# Patient Record
Sex: Male | Born: 1970
Health system: Southern US, Community
[De-identification: ages and names within clinical notes are randomized; demographics above are authoritative.]

## PROBLEM LIST (undated history)

## (undated) DIAGNOSIS — K5792 Diverticulitis of intestine, part unspecified, without perforation or abscess without bleeding: Secondary | ICD-10-CM

## (undated) DIAGNOSIS — F419 Anxiety disorder, unspecified: Secondary | ICD-10-CM

## (undated) HISTORY — DX: Anxiety disorder, unspecified: F41.9

## (undated) HISTORY — PX: BACK SURGERY: SHX140

## (undated) HISTORY — PX: HERNIA REPAIR: SHX51

## (undated) HISTORY — PX: ELBOW SURGERY: SHX618

---

## 2000-07-11 ENCOUNTER — Emergency Department (HOSPITAL_COMMUNITY): Admission: EM | Admit: 2000-07-11 | Discharge: 2000-07-11 | Payer: Self-pay | Admitting: Emergency Medicine

## 2000-07-11 ENCOUNTER — Encounter: Payer: Self-pay | Admitting: Emergency Medicine

## 2000-07-14 ENCOUNTER — Ambulatory Visit (HOSPITAL_COMMUNITY): Admission: RE | Admit: 2000-07-14 | Discharge: 2000-07-14 | Payer: Self-pay | Admitting: Pulmonary Disease

## 2001-02-25 ENCOUNTER — Ambulatory Visit (HOSPITAL_COMMUNITY): Admission: RE | Admit: 2001-02-25 | Discharge: 2001-02-25 | Payer: Self-pay | Admitting: Pulmonary Disease

## 2002-03-16 ENCOUNTER — Ambulatory Visit (HOSPITAL_COMMUNITY): Admission: RE | Admit: 2002-03-16 | Discharge: 2002-03-16 | Payer: Self-pay | Admitting: Pulmonary Disease

## 2003-12-06 ENCOUNTER — Ambulatory Visit (HOSPITAL_COMMUNITY): Admission: RE | Admit: 2003-12-06 | Discharge: 2003-12-06 | Payer: Self-pay | Admitting: Pulmonary Disease

## 2003-12-06 ENCOUNTER — Ambulatory Visit (HOSPITAL_COMMUNITY): Admission: RE | Admit: 2003-12-06 | Discharge: 2003-12-06 | Payer: Self-pay

## 2007-04-27 ENCOUNTER — Emergency Department (HOSPITAL_COMMUNITY): Admission: EM | Admit: 2007-04-27 | Discharge: 2007-04-27 | Payer: Self-pay | Admitting: Emergency Medicine

## 2007-08-11 ENCOUNTER — Encounter: Admission: RE | Admit: 2007-08-11 | Discharge: 2007-08-11 | Payer: Self-pay | Admitting: Gastroenterology

## 2010-10-09 LAB — BASIC METABOLIC PANEL
BUN: 10
CO2: 25
Calcium: 9.7
Chloride: 102
Creatinine, Ser: 1.07
GFR calc Af Amer: >60
GFR calc non Af Amer: >60
Glucose, Bld: 87
Potassium: 4.2
Sodium: 136

## 2010-10-09 LAB — CBC
HCT: 41.2
Hemoglobin: 13.9
MCHC: 33.8
MCV: 87.8
Platelets: 202
RBC: 4.7
RDW: 13.4
WBC: 13.2 — ABNORMAL HIGH

## 2010-10-09 LAB — DIFFERENTIAL
Basophils Absolute: 0.1
Basophils Relative: 0
Eosinophils Absolute: 0.1
Eosinophils Relative: 0
Lymphocytes Relative: 13
Lymphs Abs: 1.7
Monocytes Absolute: 0.8
Monocytes Relative: 6
Neutro Abs: 10.5 — ABNORMAL HIGH
Neutrophils Relative %: 80 — ABNORMAL HIGH

## 2010-10-09 LAB — URINALYSIS, ROUTINE W REFLEX MICROSCOPIC
Bilirubin Urine: NEGATIVE
Glucose, UA: NEGATIVE
Hgb urine dipstick: NEGATIVE
Ketones, ur: NEGATIVE
Nitrite: NEGATIVE
Protein, ur: NEGATIVE
Specific Gravity, Urine: 1.013
Urobilinogen, UA: 0.2
pH: 6

## 2010-10-09 LAB — LIPASE, BLOOD: Lipase: 38

## 2011-04-25 ENCOUNTER — Encounter (HOSPITAL_COMMUNITY): Payer: Self-pay | Admitting: Emergency Medicine

## 2011-04-25 ENCOUNTER — Emergency Department (HOSPITAL_COMMUNITY)
Admission: EM | Admit: 2011-04-25 | Discharge: 2011-04-25 | Disposition: A | Discharge status: Home / Self Care | Payer: Worker's Compensation | Attending: Emergency Medicine | Admitting: Emergency Medicine

## 2011-04-25 DIAGNOSIS — S058X9A Other injuries of unspecified eye and orbit, initial encounter: Secondary | ICD-10-CM | POA: Insufficient documentation

## 2011-04-25 DIAGNOSIS — S0501XA Injury of conjunctiva and corneal abrasion without foreign body, right eye, initial encounter: Secondary | ICD-10-CM

## 2011-04-25 DIAGNOSIS — Y9269 Other specified industrial and construction area as the place of occurrence of the external cause: Secondary | ICD-10-CM | POA: Insufficient documentation

## 2011-04-25 DIAGNOSIS — IMO0002 Reserved for concepts with insufficient information to code with codable children: Secondary | ICD-10-CM | POA: Insufficient documentation

## 2011-04-25 HISTORY — DX: Diverticulitis of intestine, part unspecified, without perforation or abscess without bleeding: K57.92

## 2011-04-25 MED ORDER — FLUORESCEIN SODIUM 1 MG OP STRP
ORAL_STRIP | OPHTHALMIC | Status: AC
  Administered 2011-04-25: 1 via OPHTHALMIC
  Filled 2011-04-25: qty 1

## 2011-04-25 MED ORDER — REFRESH P.M. OP OINT: TOPICAL_OINTMENT | OPHTHALMIC | Status: AC | PRN

## 2011-04-25 MED ORDER — TETRACAINE HCL 0.5 % OP SOLN
1.0000 [drp] | Freq: Once | OPHTHALMIC | Status: AC
  Administered 2011-04-25: 1 [drp] via OPHTHALMIC
  Filled 2011-04-25: qty 2

## 2011-04-25 MED ORDER — FLUORESCEIN SODIUM 1 MG OP STRP
1.0000 | ORAL_STRIP | Freq: Once | OPHTHALMIC | Status: AC
  Administered 2011-04-25: 1 via OPHTHALMIC

## 2011-04-25 MED ORDER — BACITRACIN 500 UNIT/GM OP OINT: TOPICAL_OINTMENT | Freq: Four times a day (QID) | OPHTHALMIC | Status: AC

## 2011-04-25 MED ORDER — REFRESH P.M. OP OINT: TOPICAL_OINTMENT | OPHTHALMIC | Status: DC | PRN

## 2011-04-25 NOTE — ED Notes (Signed)
Pt reports hydralic fluid splashed in his eyes approx. 1.5 hrs ago. Pt reports using the eye wash station for approx 15 min PTA. Pt reports blurry vision and burning sensation at present.

## 2011-04-25 NOTE — ED Notes (Signed)
Patient was working on [employer] and were acidently spayed by hydraulic fluid in his eyes. Patient states he is having slight burning and blurred vision. EDP with patient at this time.

## 2011-04-25 NOTE — ED Notes (Signed)
Irrigation complete. Awaiting pa to come reeval. Has approx 1500 ml lr irrigated each eye

## 2011-04-25 NOTE — ED Provider Notes (Signed)
History     CSN: [id number]  Arrival date & time 04/25/11  1239   First MD Initiated Contact with Patient 04/25/11 1302      Chief Complaint  Patient presents with  . Foreign Body in Eye    (Consider location/radiation/quality/duration/timing/severity/associated sxs/prior treatment) HPI Comments: Patient with no significant past medical history presents emergency department with a chief complaint of getting hydraulic fluid splashed in eyes bilaterally.  Incident occurred while on the job at 1115 in the morning.  Patient complains of cloudy smoke he vision but denies any headaches, nausea, vomiting, burning sensation or eye pain.  Patient reports using the eyewash station for approximately 15 minutes while at work.  Patient has no other complaints at this time.  Hazardous information:  Product: polyether polyol Ingredients: polyoxyalkylene glycol & Pentaerythritolester (hydrocarbons) pH: 6.5-7.5  The history is provided by the patient.    Past Medical History  Diagnosis Date  . Diverticulitis     Past Surgical History  Procedure Date  . Hernia repair   . Elbow surgery     History reviewed. No pertinent family history.  History  Substance Use Topics  . Smoking status: Former Smoker    Quit date: 01/15/2007  . Smokeless tobacco: Not on file  . Alcohol Use: Yes     weekends      Review of Systems  Allergies  Review of patient's allergies indicates no known allergies.  Home Medications   Current Outpatient Rx  Name Route Sig Dispense Refill  . CLONAZEPAM 1 MG PO TABS Oral Take 1 mg by mouth at bedtime as needed. For sleep    . DIAZEPAM 10 MG PO TABS Oral Take 10 mg by mouth at bedtime as needed. For sleep    . GUAIFENESIN ER 600 MG PO TB12 Oral Take 1,200 mg by mouth 2 (two) times daily.    Marland Kitchen HYDROCODONE-IBUPROFEN 7.5-200 MG PO TABS Oral Take 1 tablet by mouth every 8 (eight) hours as needed. For pain    . ADULT MULTIVITAMIN W/MINERALS CH Oral Take 1 tablet by  mouth daily.    Marland Kitchen NAPROXEN SODIUM 220 MG PO TABS Oral Take 220 mg by mouth daily as needed. For pain      BP 120/79  Pulse 66  Temp 99.4 F (37.4 C)  Resp 16  SpO2 96%  Physical Exam  Nursing note and vitals reviewed. Constitutional: He is oriented to person, place, and time. He appears well-developed and well-nourished. No distress.  HENT:  Head: Normocephalic and atraumatic.  Eyes: Conjunctivae and EOM are normal. Pupils are equal, round, and reactive to light.       PH pre irrigation= 8   Neck: Normal range of motion.  Pulmonary/Chest: Effort normal.  Musculoskeletal: Normal range of motion.  Neurological: He is alert and oriented to person, place, and time.  Skin: Skin is warm and dry. No rash noted. He is not diaphoretic.  Psychiatric: He has a normal mood and affect. His behavior is normal.    ED Course  Procedures (including critical care time)  Labs Reviewed - No data to display No results found.   No diagnosis found.  pH originally 8, after irrigation litmus paper did not darken as much, pH appears WNL.   MDM  Eye irritant  Pt presents to ED after hydraulic fluid splashed into eyes bilaterally. Eyes have been irrigated, minimal fluoracaine uptake in inferior eyes bilaterally. Consults listed below. Pt is to follow up with opthamlogy. Pt will be dc  with Bacitracin opthalmic TID and has an appointment scheduled for tmw with Dr. Randon Goldsmith.   Consults:  Pharmacology, Posion control, Opthalmology All agree with ED tx adn dc f-u plan.           Jaci Carrel, [place] 04/25/11 1550

## 2011-04-25 NOTE — ED Notes (Addendum)
Lab here to collect workman comp urine drug screen. Unable to complete due to no drivers liscense

## 2011-04-25 NOTE — Discharge Instructions (Signed)
Keep your follow up appointment scheduled with the eye doctor listed above. Use medications as directed. Retuen to the ED if you begin to loose your vision have worsening eye pain

## 2011-04-25 NOTE — ED Provider Notes (Signed)
Medical screening examination/treatment/procedure(s) were performed by non-physician practitioner and as supervising physician I was immediately available for consultation/collaboration.   Glynn Octave, MD 04/25/11 1904

## 2011-04-25 NOTE — ED Notes (Signed)
Procedure explained to pt. Verbalizes understanding

## 2011-04-25 NOTE — ED Notes (Signed)
Pt states some pain while irrigation in progress. Has gotten approx 700 cc irrigated thru. Pt redosed with tetrecaine per pa approval

## 2011-08-26 ENCOUNTER — Other Ambulatory Visit: Payer: Self-pay | Admitting: Chiropractic Medicine

## 2011-08-26 ENCOUNTER — Ambulatory Visit
Admission: RE | Admit: 2011-08-26 | Discharge: 2011-08-26 | Disposition: A | Discharge status: Home / Self Care | Payer: 59 | Source: Ambulatory Visit | Attending: Chiropractic Medicine | Admitting: Chiropractic Medicine

## 2011-08-26 DIAGNOSIS — M5416 Radiculopathy, lumbar region: Secondary | ICD-10-CM

## 2011-08-26 DIAGNOSIS — M542 Cervicalgia: Secondary | ICD-10-CM

## 2012-11-18 ENCOUNTER — Ambulatory Visit (INDEPENDENT_AMBULATORY_CARE_PROVIDER_SITE_OTHER): Payer: 59 | Admitting: Family Medicine

## 2012-11-18 ENCOUNTER — Encounter: Payer: Self-pay | Admitting: Family Medicine

## 2012-11-18 VITALS — BP 130/90 | HR 88 | Temp 97.7°F | Resp 18 | Ht 68.0 in | Wt 184.0 lb

## 2012-11-18 DIAGNOSIS — B360 Pityriasis versicolor: Secondary | ICD-10-CM

## 2012-11-18 DIAGNOSIS — J029 Acute pharyngitis, unspecified: Secondary | ICD-10-CM

## 2012-11-18 DIAGNOSIS — J019 Acute sinusitis, unspecified: Secondary | ICD-10-CM

## 2012-11-18 LAB — RAPID STREP SCREEN (MED CTR MEBANE ONLY): Streptococcus, Group A Screen (Direct): NEGATIVE

## 2012-11-18 MED ORDER — AMOXICILLIN 500 MG PO CAPS: 500.0000 mg | ORAL_CAPSULE | Freq: Three times a day (TID) | ORAL | Status: DC

## 2012-11-18 MED ORDER — FLUTICASONE PROPIONATE 50 MCG/ACT NA SUSP: 2.0000 | Freq: Every day | NASAL | Status: DC

## 2012-11-18 MED ORDER — FLUCONAZOLE 150 MG PO TABS: 150.0000 mg | ORAL_TABLET | ORAL | Status: DC

## 2012-11-18 NOTE — Patient Instructions (Signed)
Take antibiotics as prescribed Continue mucinex flonase given Use diflucan weekly for 4 weeks F/U as needed

## 2012-11-19 DIAGNOSIS — B360 Pityriasis versicolor: Secondary | ICD-10-CM | POA: Insufficient documentation

## 2012-11-19 DIAGNOSIS — J019 Acute sinusitis, unspecified: Secondary | ICD-10-CM | POA: Insufficient documentation

## 2012-11-19 NOTE — Progress Notes (Signed)
  Subjective:    Patient ID: Maurice Sims, male    DOB: [birth year], 42 y.o.   MRN: [id number]  HPI  PCP- Dr. Kari Baars  Pt here with sinus pressure drainage for past 3 weeks. +post nasal drip causing sore throat. Denies fever. Has been using nasal saline and mucinex on regular basis but not improving. Non smoker. Typically has difficulty around this time of year.  Pt also has history of recurrent tinea versicolor , asked for fluconazole to be refilled   Review of Systems  GEN- denies fatigue, fever, weight loss,weakness, recent illness HEENT- denies eye drainage, change in vision, +nasal discharge, CVS- denies chest pain, palpitations RESP- denies SOB, cough, wheeze Neuro- denies headache, dizziness, syncope, seizure activity      Objective:   Physical Exam  GEN- NAD, alert and oriented x3 HEENT- PERRL, EOMI, non injected sclera, pink conjunctiva, MMM, oropharynx mild injection, TM clear bilat no effusion, + maxillary sinus tenderness, inflammed turbinates,  Nasal drainage  Neck- Supple, no LAD CVS- RRR, no murmur RESP-CTAB EXT- No edema Pulses- Radial 2+ Skin- hypopigmented circular lesions scattered across back, no erythema, mild scaling. No papular lesions        Assessment & Plan:

## 2012-11-19 NOTE — Assessment & Plan Note (Signed)
Fluconazole x 4 weeks Failed topical treatments May need derm for reoccurence

## 2012-11-19 NOTE — Assessment & Plan Note (Signed)
Amox, flonase, mucinex

## 2012-12-15 ENCOUNTER — Other Ambulatory Visit: Payer: Self-pay | Admitting: Family Medicine

## 2012-12-15 NOTE — Telephone Encounter (Signed)
LMTRC to find out what he needs amoxicillin for.

## 2013-03-21 ENCOUNTER — Other Ambulatory Visit: Payer: Self-pay | Admitting: Family Medicine

## 2013-05-18 ENCOUNTER — Encounter: Payer: Self-pay | Admitting: Family Medicine

## 2013-05-18 ENCOUNTER — Ambulatory Visit (INDEPENDENT_AMBULATORY_CARE_PROVIDER_SITE_OTHER): Payer: 59 | Admitting: Family Medicine

## 2013-05-18 VITALS — BP 132/80 | HR 64 | Temp 98.0°F | Resp 18 | Ht 68.0 in | Wt 183.0 lb

## 2013-05-18 DIAGNOSIS — R109 Unspecified abdominal pain: Secondary | ICD-10-CM | POA: Insufficient documentation

## 2013-05-18 DIAGNOSIS — K5792 Diverticulitis of intestine, part unspecified, without perforation or abscess without bleeding: Secondary | ICD-10-CM

## 2013-05-18 DIAGNOSIS — K921 Melena: Secondary | ICD-10-CM

## 2013-05-18 DIAGNOSIS — K5732 Diverticulitis of large intestine without perforation or abscess without bleeding: Secondary | ICD-10-CM

## 2013-05-18 DIAGNOSIS — IMO0002 Reserved for concepts with insufficient information to code with codable children: Secondary | ICD-10-CM

## 2013-05-18 LAB — CBC WITH DIFFERENTIAL/PLATELET
Basophils Absolute: 0 10*3/uL (ref 0.0–0.1)
Basophils Relative: 0 % (ref 0–1)
EOS ABS: 0.1 10*3/uL (ref 0.0–0.7)
EOS PCT: 1 % (ref 0–5)
HCT: 42.7 % (ref 39.0–52.0)
HEMOGLOBIN: 14.4 g/dL (ref 13.0–17.0)
LYMPHS ABS: 2.4 10*3/uL (ref 0.7–4.0)
LYMPHS PCT: 36 % (ref 12–46)
MCH: 29 pg (ref 26.0–34.0)
MCHC: 33.7 g/dL (ref 30.0–36.0)
MCV: 86.1 fL (ref 78.0–100.0)
MONOS PCT: 8 % (ref 3–12)
Monocytes Absolute: 0.5 10*3/uL (ref 0.1–1.0)
Neutro Abs: 3.7 10*3/uL (ref 1.7–7.7)
Neutrophils Relative %: 55 % (ref 43–77)
PLATELETS: 222 10*3/uL (ref 150–400)
RBC: 4.96 MIL/uL (ref 4.22–5.81)
RDW: 13.8 % (ref 11.5–15.5)
WBC: 6.8 10*3/uL (ref 4.0–10.5)

## 2013-05-18 LAB — COMPREHENSIVE METABOLIC PANEL
ALT: 37 U/L (ref 0–53)
AST: 31 U/L (ref 0–37)
Albumin: 4.3 g/dL (ref 3.5–5.2)
Alkaline Phosphatase: 56 U/L (ref 39–117)
BILIRUBIN TOTAL: 0.3 mg/dL (ref 0.2–1.2)
BUN: 14 mg/dL (ref 6–23)
CALCIUM: 9.9 mg/dL (ref 8.4–10.5)
CHLORIDE: 104 meq/L (ref 96–112)
CO2: 25 meq/L (ref 19–32)
Creat: 1.15 mg/dL (ref 0.50–1.35)
GLUCOSE: 79 mg/dL (ref 70–99)
Potassium: 4.5 mEq/L (ref 3.5–5.3)
Sodium: 140 mEq/L (ref 135–145)
Total Protein: 6.9 g/dL (ref 6.0–8.3)

## 2013-05-18 LAB — LIPASE: LIPASE: 92 U/L — AB (ref 0–75)

## 2013-05-18 MED ORDER — CLONAZEPAM 1 MG PO TABS: 1.0000 mg | ORAL_TABLET | Freq: Two times a day (BID) | ORAL | Status: DC | PRN

## 2013-05-18 MED ORDER — AMOXICILLIN-POT CLAVULANATE 875-125 MG PO TABS: 1.0000 | ORAL_TABLET | Freq: Two times a day (BID) | ORAL | Status: DC

## 2013-05-18 NOTE — Progress Notes (Signed)
Patient ID: Maurice GraterChristopher D Sims, male   DOB: 10-17-1970, 43 y.o.   MRN: [id number]   Subjective:    Patient ID: Maurice Sims, male    DOB: 10-17-1970, 43 y.o.   MRN: [id number]  Patient presents for Diverticulitis and L hand middle finger infection  patient here with lower abdominal pain. 3 weeks ago he had a bout of what he thinks is diverticulitis. He was seen by the urgent care. He had low-grade fever as well as lower, pain worse on the left side and some mild diarrhea. He also noted some dark stools which look like old blood. He does have a history of diverticulitis also have some ulcerations in the sigmoid colon and was recommended to have partial colon removal however he declined this a few years ago. As long as he watches what he eats he typically does not have any flares with his abdominal pain and is diverticulitis. He did complete a course of Cipro Flagyl though he thinks that he had some side effects from the Flagyl including back pain and he noticed that a new infection popped up on the tip of his middle finger on his left hand ( 1 week ago)and the tip of his nose during the course of the medicine. He still has some mild low-grade fever as well as left-sided abdominal pain. Denies nausea vomiting here    Review Of Systems:  GEN- denies fatigue,+ fever, weight loss,weakness, recent illness HEENT- denies eye drainage, change in vision, nasal discharge, CVS- denies chest pain, palpitations RESP- denies SOB, cough, wheeze ABD- denies N/V, change in stools,+ abd pain GU- denies dysuria, hematuria, dribbling, incontinence Neuro- denies headache, dizziness, syncope, seizure activity       Objective:    BP 132/80  Pulse 64  Temp(Src) 98 F (36.7 C) (Oral)  Resp 18  Ht 5\' 8"  (1.727 m)  Wt 183 lb (83.008 kg)  BMI 27.83 kg/m2 GEN- NAD, alert and oriented x3 HEENT- PERRL, EOMI, non injected sclera, pink conjunctiva, MMM, oropharynx clear Neck- Supple,no LAD CVS- RRR, no  murmur RESP-CTAB ABD-NABS,soft , TTP Lower quadrants, no rebound, no guarding, no mass palpated Skin- Left hand middle digit- swelling towards tip of finger and lateral edge, +erythema, no discrete abscess, erythema tip of nose EXT- No edema Pulses- Radial 2+        Assessment & Plan:      Problem List Items Addressed This Visit   Felon - Primary   Diverticulitis   Relevant Orders      CBC with Differential      Comprehensive metabolic panel      Lipase   Abdominal pain, unspecified site   Relevant Orders      CBC with Differential      Comprehensive metabolic panel      Note: This dictation was prepared with Dragon dictation along with smaller phrase technology. Any transcriptional errors that result from this process are unintentional.

## 2013-05-18 NOTE — Assessment & Plan Note (Signed)
No discrete abscess that I can incise and drain today. Epson salt soaks as well as antibiotics. I do not see any particular infection the tip of his nose that he does have some mild ear edema

## 2013-05-18 NOTE — Patient Instructions (Signed)
Drink COntrast before appointment for CT scan Start Augmentin We will call with all results  RELEASE OF RECORDS- Dr. Kari BaarsEdward Hawkins F/U as needed

## 2013-05-18 NOTE — Assessment & Plan Note (Signed)
Per above a CT scan needed to rule out colitis versus diverticulitis versus inflammatory bowel disease especially at his age

## 2013-05-18 NOTE — Assessment & Plan Note (Signed)
Note he was seeing Dr. Kari BaarsEdward Hawkins however he was to transfer his care to our office as this [place]. With his abdominal pain as well as the old blood seen in his history of ulcerations in the colon I think this more to repeat CPT scan of abdomen and pelvis with contrast. He was seen by Dr. Elnoria HowardHung in the past who did his colonoscopy. Pending results we'll get gastroenterology involved. I will go ahead and start him on Augmentin CBC and metabolic panel to be checked

## 2013-05-20 ENCOUNTER — Ambulatory Visit
Admission: RE | Admit: 2013-05-20 | Discharge: 2013-05-20 | Disposition: A | Discharge status: Home / Self Care | Payer: 59 | Source: Ambulatory Visit | Attending: Family Medicine | Admitting: Family Medicine

## 2013-05-20 DIAGNOSIS — K921 Melena: Secondary | ICD-10-CM

## 2013-05-20 DIAGNOSIS — R109 Unspecified abdominal pain: Secondary | ICD-10-CM

## 2013-05-20 DIAGNOSIS — K5792 Diverticulitis of intestine, part unspecified, without perforation or abscess without bleeding: Secondary | ICD-10-CM

## 2013-05-20 MED ORDER — IOHEXOL 300 MG/ML  SOLN
100.0000 mL | Freq: Once | INTRAMUSCULAR | Status: AC | PRN
  Administered 2013-05-20: 100 mL via INTRAVENOUS

## 2013-05-21 ENCOUNTER — Ambulatory Visit: Payer: 59 | Admitting: Family Medicine

## 2013-06-14 ENCOUNTER — Telehealth: Payer: Self-pay | Admitting: *Deleted

## 2013-06-14 NOTE — Telephone Encounter (Deleted)
Refill appropriate and filled per protocol. 

## 2013-06-14 NOTE — Telephone Encounter (Signed)
Ok to refill Clonazepam??  Last office visit/ refill 05/18/2013.

## 2013-06-14 NOTE — Telephone Encounter (Signed)
He was given script 5/5 with 2 refills on it, clarify with pharmacy

## 2013-06-15 NOTE — Telephone Encounter (Signed)
Call placed to patient. Reports that he is switching to [facility] for his prescriptions.   States that he has the medication and the refills have been sent to [facility]. He does not require a refill at this time.

## 2013-08-04 ENCOUNTER — Encounter: Payer: Self-pay | Admitting: Family Medicine

## 2013-08-04 ENCOUNTER — Ambulatory Visit (INDEPENDENT_AMBULATORY_CARE_PROVIDER_SITE_OTHER): Payer: 59 | Admitting: Family Medicine

## 2013-08-04 ENCOUNTER — Encounter: Payer: Self-pay | Admitting: Pulmonary Disease

## 2013-08-04 VITALS — BP 130/78 | HR 76 | Temp 98.7°F | Resp 14 | Ht 68.0 in | Wt 180.0 lb

## 2013-08-04 DIAGNOSIS — T148 Other injury of unspecified body region: Secondary | ICD-10-CM

## 2013-08-04 DIAGNOSIS — J069 Acute upper respiratory infection, unspecified: Secondary | ICD-10-CM

## 2013-08-04 DIAGNOSIS — W57XXXA Bitten or stung by nonvenomous insect and other nonvenomous arthropods, initial encounter: Secondary | ICD-10-CM

## 2013-08-04 DIAGNOSIS — B360 Pityriasis versicolor: Secondary | ICD-10-CM

## 2013-08-04 MED ORDER — AZELASTINE-FLUTICASONE 137-50 MCG/ACT NA SUSP: NASAL | Status: DC

## 2013-08-04 MED ORDER — DOXYCYCLINE HYCLATE 100 MG PO TABS: 100.0000 mg | ORAL_TABLET | Freq: Two times a day (BID) | ORAL | Status: DC

## 2013-08-04 MED ORDER — HYDROCODONE-HOMATROPINE 5-1.5 MG/5ML PO SYRP: 5.0000 mL | ORAL_SOLUTION | Freq: Three times a day (TID) | ORAL | Status: DC | PRN

## 2013-08-04 MED ORDER — FLUCONAZOLE 150 MG PO TABS: 150.0000 mg | ORAL_TABLET | ORAL | Status: DC

## 2013-08-04 NOTE — Assessment & Plan Note (Signed)
Treat with Diflucan x4 weeks

## 2013-08-04 NOTE — Assessment & Plan Note (Addendum)
Cough medication given. He also has the doxycycline secondary to a tick bite Dymista for nasal symptoms

## 2013-08-04 NOTE — Assessment & Plan Note (Signed)
He had recent tick bites and some fatigue and muscle aches. As he has an acute respiratory illness this may be due to the virus however he did have a tick exposure and the timing does state. I will go ahead and put him on doxycycline for 7 days for

## 2013-08-04 NOTE — Patient Instructions (Signed)
Start antibiotics  Take the cough medicine as needed  Work note-- For 7/22 and 7/23 , may return on 7/24  F/U as needed

## 2013-08-04 NOTE — Progress Notes (Signed)
Patient ID: Maurice Sims, male   DOB: 1970/06/03, 43 y.o.   MRN: [id number]   Subjective:    Patient ID: Maurice GraterChristopher D Kaleta, male    DOB: 1970/06/03, 43 y.o.   MRN: [id number]  Patient presents for Illness and Tick Bite  patient here with sore throat nonproductive cough headache nasal drainage for the past 2-3 days. He also had low-grade fever. He is taking Echinacea and vitamin C over-the-counter as well as Mucinex. He's also had some body aches prior to his upper respiratory symptoms started these occurred after he pulled a couple of ticks off for him while he was hiking about 2 weeks ago. He did have small amount of erythema around one that was on his lower back but that is now improved.    Review Of Systems:  GEN- + fatigue, +fever, weight loss,weakness, recent illness HEENT- denies eye drainage, change in vision,+ nasal discharge, CVS- denies chest pain, palpitations RESP- denies SOB, cough, wheeze ABD- denies N/V, change in stools, abd pain GU- denies dysuria, hematuria, dribbling, incontinence MSK- denies joint pain, +muscle aches, injury Neuro- denies headache, dizziness, syncope, seizure activity       Objective:    BP 130/78  Pulse 76  Temp(Src) 98.7 F (37.1 C) (Oral)  Resp 14  Ht 5\' 8"  (1.727 m)  Wt 180 lb (81.647 kg)  BMI 27.38 kg/m2 GEN- NAD, alert and oriented x3, non toxic appearing HEENT- PERRL, EOMI, non injected sclera, pink conjunctiva, MMM, oropharynx mild injection, TM clear bilat no effusion,  + maxillary sinus tenderness, clear  Nasal drainage  Neck- Supple, no LAD CVS- RRR, no murmur RESP-CTAB Skin- hypopigemented macules on upper back , few lesions mid back, with mild scaling, previous tick bite- small erythematous pinpoint macule EXT- No edema Pulses- Radial 2+         Assessment & Plan:      Problem List Items Addressed This Visit   Tinea versicolor - Primary     Treat with Diflucan x4 weeks    Tick bite     He had  recent tick bites and some fatigue and muscle aches. As he has an acute respiratory illness this may be due to the virus however he did have a tick exposure and the timing does state. I will go ahead and put him on doxycycline for 7 days for    Acute URI     Cough medication given. He also has the doxycycline secondary to a tick bite    Relevant Medications      fluconazole (DIFLUCAN) tablet 150 mg      Note: This dictation was prepared with Dragon dictation along with smaller phrase technology. Any transcriptional errors that result from this process are unintentional.

## 2013-09-06 ENCOUNTER — Emergency Department (HOSPITAL_COMMUNITY): Payer: No Typology Code available for payment source

## 2013-09-06 ENCOUNTER — Emergency Department (HOSPITAL_COMMUNITY)
Admission: EM | Admit: 2013-09-06 | Discharge: 2013-09-06 | Disposition: A | Discharge status: Home / Self Care | Payer: No Typology Code available for payment source | Attending: Emergency Medicine | Admitting: Emergency Medicine

## 2013-09-06 ENCOUNTER — Encounter (HOSPITAL_COMMUNITY): Payer: Self-pay | Admitting: Emergency Medicine

## 2013-09-06 DIAGNOSIS — Y9389 Activity, other specified: Secondary | ICD-10-CM | POA: Insufficient documentation

## 2013-09-06 DIAGNOSIS — Y92410 Unspecified street and highway as the place of occurrence of the external cause: Secondary | ICD-10-CM | POA: Insufficient documentation

## 2013-09-06 DIAGNOSIS — S0990XA Unspecified injury of head, initial encounter: Secondary | ICD-10-CM | POA: Insufficient documentation

## 2013-09-06 DIAGNOSIS — S80812A Abrasion, left lower leg, initial encounter: Secondary | ICD-10-CM

## 2013-09-06 DIAGNOSIS — S99929A Unspecified injury of unspecified foot, initial encounter: Secondary | ICD-10-CM

## 2013-09-06 DIAGNOSIS — K5732 Diverticulitis of large intestine without perforation or abscess without bleeding: Secondary | ICD-10-CM | POA: Diagnosis not present

## 2013-09-06 DIAGNOSIS — S8990XA Unspecified injury of unspecified lower leg, initial encounter: Secondary | ICD-10-CM | POA: Diagnosis present

## 2013-09-06 DIAGNOSIS — Z87891 Personal history of nicotine dependence: Secondary | ICD-10-CM | POA: Insufficient documentation

## 2013-09-06 DIAGNOSIS — S99919A Unspecified injury of unspecified ankle, initial encounter: Secondary | ICD-10-CM | POA: Diagnosis present

## 2013-09-06 DIAGNOSIS — IMO0002 Reserved for concepts with insufficient information to code with codable children: Secondary | ICD-10-CM | POA: Insufficient documentation

## 2013-09-06 DIAGNOSIS — Z79899 Other long term (current) drug therapy: Secondary | ICD-10-CM | POA: Diagnosis not present

## 2013-09-06 DIAGNOSIS — S42009A Fracture of unspecified part of unspecified clavicle, initial encounter for closed fracture: Secondary | ICD-10-CM | POA: Insufficient documentation

## 2013-09-06 DIAGNOSIS — S80211A Abrasion, right knee, initial encounter: Secondary | ICD-10-CM

## 2013-09-06 DIAGNOSIS — S42001A Fracture of unspecified part of right clavicle, initial encounter for closed fracture: Secondary | ICD-10-CM

## 2013-09-06 MED ORDER — ONDANSETRON HCL 4 MG PO TABS: 4.0000 mg | ORAL_TABLET | Freq: Four times a day (QID) | ORAL | Status: DC

## 2013-09-06 MED ORDER — HYDROMORPHONE HCL PF 1 MG/ML IJ SOLN
1.0000 mg | Freq: Once | INTRAMUSCULAR | Status: AC
Start: 2013-09-06 — End: 2013-09-06
  Administered 2013-09-06: 1 mg via INTRAVENOUS
  Filled 2013-09-06: qty 1

## 2013-09-06 MED ORDER — OXYCODONE-ACETAMINOPHEN 5-325 MG PO TABS
2.0000 | ORAL_TABLET | Freq: Once | ORAL | Status: AC
  Administered 2013-09-06: 2 via ORAL
  Filled 2013-09-06: qty 2

## 2013-09-06 MED ORDER — OXYCODONE-ACETAMINOPHEN 5-325 MG PO TABS: 1.0000 | ORAL_TABLET | ORAL | Status: DC | PRN

## 2013-09-06 MED ORDER — ONDANSETRON HCL 4 MG/2ML IJ SOLN
4.0000 mg | Freq: Once | INTRAMUSCULAR | Status: AC
  Administered 2013-09-06: 4 mg via INTRAVENOUS
  Filled 2013-09-06: qty 2

## 2013-09-06 NOTE — ED Notes (Signed)
Pt reporting some mild nausea
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
## 2013-09-06 NOTE — Discharge Instructions (Signed)
Wear sling as needed.  Clavicle Fracture The clavicle, also called the collarbone, is the long bone that connects your shoulder to your rib cage. You can feel your collarbone at the top of your shoulders and rib cage. A clavicle fracture is a broken clavicle. It is a common injury that can happen at any age.  CAUSES Common causes of a clavicle fracture include:  A direct blow to your shoulder.  A car accident.  A fall, especially if you try to break your fall with an outstretched arm. RISK FACTORS You may be at increased risk if:  You are younger than 25 years or older than 75 years. Most clavicle fractures happen to people who are younger than 25 years.  You are a male.  You play contact sports. SIGNS AND SYMPTOMS A fractured clavicle is painful. It also makes it hard to move your arm. Other signs and symptoms may include:  A shoulder that drops downward and forward.  Pain when trying to lift your shoulder.  Bruising, swelling, and tenderness over your clavicle.  A grinding noise when you try to move your shoulder.  A bump over your clavicle. DIAGNOSIS Your health care provider can usually diagnose a clavicle fracture by asking about your injury and examining your shoulder and clavicle. He or she may take an X-ray to determine the position of your clavicle. TREATMENT Treatment depends on the position of your clavicle after the fracture:  If the broken ends of the bone are not out of place, your health care provider may put your arm in a sling or wrap a support bandage around your chest (figure-of-eight wrap).  If the broken ends of the bone are out of place, you may need surgery. Surgery may involve placing screws, pins, or plates to keep your clavicle stable while it heals. Healing may take about 3 months. When your health care provider thinks your fracture has healed enough, you may have to do physical therapy to regain normal movement and build up your arm strength. HOME  CARE INSTRUCTIONS   Apply ice to the injured area:  Put ice in a plastic bag.  Place a towel between your skin and the bag.  Leave the ice on for 20 minutes, 2-3 times a day.  If you have a wrap or splint:  Wear it all the time, and remove it only to take a bath or shower.  When you bathe or shower, keep your shoulder in the same position as when the sling or wrap is on.  Do not lift your arm.  If you have a figure-of-eight wrap:  Another person must tighten it every day.  It should be tight enough to hold your shoulders back.  Allow enough room to place your index finger between your body and the strap.  Loosen the wrap immediately if you feel numbness or tingling in your hands.  Only take medicines as directed by your health care provider.  Avoid activities that make the injury or pain worse for 4-6 weeks after surgery.  Keep all follow-up appointments. SEEK MEDICAL CARE IF:  Your medicine is not helping to relieve pain and swelling. SEEK IMMEDIATE MEDICAL CARE IF:  Your arm is numb, cold, or pale, even when the splint is loose. MAKE SURE YOU:   Understand these instructions.  Will watch your condition.  Will get help right away if you are not doing well or get worse. Document Released: 10/10/2004 Document Revised: 01/05/2013 Document Reviewed: 11/23/2012 [employer] Patient Information 2015  [employer]. This information is not intended to replace advice given to you by your health care provider. Make sure you discuss any questions you have with your health care provider.  Abrasion An abrasion is a cut or scrape of the skin. Abrasions do not extend through all layers of the skin and most heal within 10 days. It is important to care for your abrasion properly to prevent infection. CAUSES  Most abrasions are caused by falling on, or gliding across, the ground or other surface. When your skin rubs on something, the outer and inner layer of skin rubs off, causing an  abrasion. DIAGNOSIS  Your caregiver will be able to diagnose an abrasion during a physical exam.  TREATMENT  Your treatment depends on how large and deep the abrasion is. Generally, your abrasion will be cleaned with water and a mild soap to remove any dirt or debris. An antibiotic ointment may be put over the abrasion to prevent an infection. A bandage (dressing) may be wrapped around the abrasion to keep it from getting dirty.  You may need a tetanus shot if:  You cannot remember when you had your last tetanus shot.  You have never had a tetanus shot.  The injury broke your skin. If you get a tetanus shot, your arm may swell, get red, and feel warm to the touch. This is common and not a problem. If you need a tetanus shot and you choose not to have one, there is a rare chance of getting tetanus. Sickness from tetanus can be serious.  HOME CARE INSTRUCTIONS   If a dressing was applied, change it at least once a day or as directed by your caregiver. If the bandage sticks, soak it off with warm water.   Wash the area with water and a mild soap to remove all the ointment 2 times a day. Rinse off the soap and pat the area dry with a clean towel.   Reapply any ointment as directed by your caregiver. This will help prevent infection and keep the bandage from sticking. Use gauze over the wound and under the dressing to help keep the bandage from sticking.   Change your dressing right away if it becomes wet or dirty.   Only take over-the-counter or prescription medicines for pain, discomfort, or fever as directed by your caregiver.   Follow up with your caregiver within 24-48 hours for a wound check, or as directed. If you were not given a wound-check appointment, look closely at your abrasion for redness, swelling, or pus. These are signs of infection. SEEK IMMEDIATE MEDICAL CARE IF:   You have increasing pain in the wound.   You have redness, swelling, or tenderness around the wound.    You have pus coming from the wound.   You have a fever or persistent symptoms for more than 2-3 days.  You have a fever and your symptoms suddenly get worse.  You have a bad smell coming from the wound or dressing.  MAKE SURE YOU:   Understand these instructions.  Will watch your condition.  Will get help right away if you are not doing well or get worse. Document Released: 10/10/2004 Document Revised: 12/18/2011 Document Reviewed: 12/04/2010 [facility] Patient Information 2015 [place], [place]. This information is not intended to replace advice given to you by your health care provider. Make sure you discuss any questions you have with your health care provider.   Bicycling, Rules for Helmets Properly wearing a helmet when cycling is  your best means of protection against injury. You need to know the right way to wear a helmet and what kind of helmet to buy. WEAR A HELMET  A helmet is your last line of defense in an accident; never ride without one.  Helmets can reduce serious head injuries by 85% in a crash. ALWAYS WEAR A PROPERLY FITTING HELMET  A helmet will not protect you if it does not fit properly.  Make sure that the helmet fits on top of the head, not tipped back.  Always wear a helmet while riding a bike, no matter how short the trip.  After a crash or any impact that affects your helmet, replace it immediately. SHELL AND PADS  Find the smallest helmet shell size that fits over your head.  Helmet pads should not be used to make a helmet fit your head that is otherwise too big.  Leave about two-fingers width between your eyebrows and the front brim of the helmet. STRAPS  The straps should be joined just under each ear at the jawbone.  The buckle should be snug with your mouth completely open.  Periodically check your strap adjustment. Improper fit can make a helmet useless. VENTILATION  In general, the more vents the better. Improper ventilation can  cause overheating.  Helmets with good ventilation can actually be cooler than riding with no helmet at all.  More vents usually mean a higher priced helmet. Buy one that you will want to wear. COLORS  Helmets come in all different colors and models. Buy a highly visible color.  Shell color does not affect the temperature; black shell will not be hotter in the sun.  Pick a color that encourages you to wear it. Document Released: 01/03/2003 Document Revised: 03/25/2011 Document Reviewed: 05/27/2008 [facility] Patient Information 2015 [place], [place]. This information is not intended to replace advice given to you by your health care provider. Make sure you discuss any questions you have with your health care provider.  Acetaminophen; Oxycodone tablets What is this medicine? ACETAMINOPHEN; OXYCODONE (a set a MEE noe fen; ox i KOE done) is a pain reliever. It is used to treat mild to moderate pain. This medicine may be used for other purposes; ask your health care provider or pharmacist if you have questions. COMMON BRAND NAME(S): Endocet, Magnacet, Narvox, Percocet, Perloxx, Primalev, Primlev, Roxicet, Xolox What should I tell my health care provider before I take this medicine? They need to know if you have any of these conditions: -brain tumor -Crohn's disease, inflammatory bowel disease, or ulcerative colitis -drug abuse or addiction -head injury -heart or circulation problems -if you often drink alcohol -kidney disease or problems going to the bathroom -liver disease -lung disease, asthma, or breathing problems -an unusual or allergic reaction to acetaminophen, oxycodone, other opioid analgesics, other medicines, foods, dyes, or preservatives -pregnant or trying to get pregnant -breast-feeding How should I use this medicine? Take this medicine by mouth with a full glass of water. Follow the directions on the prescription label. Take your medicine at regular intervals. Do not take  your medicine more often than directed. Talk to your pediatrician regarding the use of this medicine in children. Special care may be needed. Patients over 85 years old may have a stronger reaction and need a smaller dose. Overdosage: If you think you have taken too much of this medicine contact a poison control center or emergency room at once. NOTE: This medicine is only for you. Do not share this medicine with  others. What if I miss a dose? If you miss a dose, take it as soon as you can. If it is almost time for your next dose, take only that dose. Do not take double or extra doses. What may interact with this medicine? -alcohol -antihistamines -barbiturates like amobarbital, butalbital, butabarbital, methohexital, pentobarbital, phenobarbital, thiopental, and secobarbital -benztropine -drugs for bladder problems like solifenacin, trospium, oxybutynin, tolterodine, hyoscyamine, and methscopolamine -drugs for breathing problems like ipratropium and tiotropium -drugs for certain stomach or intestine problems like propantheline, homatropine methylbromide, glycopyrrolate, atropine, belladonna, and dicyclomine -general anesthetics like etomidate, ketamine, nitrous oxide, propofol, desflurane, enflurane, halothane, isoflurane, and sevoflurane -medicines for depression, anxiety, or psychotic disturbances -medicines for sleep -muscle relaxants -naltrexone -narcotic medicines (opiates) for pain -phenothiazines like perphenazine, thioridazine, chlorpromazine, mesoridazine, fluphenazine, prochlorperazine, promazine, and trifluoperazine -scopolamine -tramadol -trihexyphenidyl This list may not describe all possible interactions. Give your health care provider a list of all the medicines, herbs, non-prescription drugs, or dietary supplements you use. Also tell them if you smoke, drink alcohol, or use illegal drugs. Some items may interact with your medicine. What should I watch for while using this  medicine? Tell your doctor or health care professional if your pain does not go away, if it gets worse, or if you have new or a different type of pain. You may develop tolerance to the medicine. Tolerance means that you will need a higher dose of the medication for pain relief. Tolerance is normal and is expected if you take this medicine for a long time. Do not suddenly stop taking your medicine because you may develop a severe reaction. Your body becomes used to the medicine. This does NOT mean you are addicted. Addiction is a behavior related to getting and using a drug for a non-medical reason. If you have pain, you have a medical reason to take pain medicine. Your doctor will tell you how much medicine to take. If your doctor wants you to stop the medicine, the dose will be slowly lowered over time to avoid any side effects. You may get drowsy or dizzy. Do not drive, use machinery, or do anything that needs mental alertness until you know how this medicine affects you. Do not stand or sit up quickly, especially if you are an older patient. This reduces the risk of dizzy or fainting spells. Alcohol may interfere with the effect of this medicine. Avoid alcoholic drinks. There are different types of narcotic medicines (opiates) for pain. If you take more than one type at the same time, you may have more side effects. Give your health care provider a list of all medicines you use. Your doctor will tell you how much medicine to take. Do not take more medicine than directed. Call emergency for help if you have problems breathing. The medicine will cause constipation. Try to have a bowel movement at least every 2 to 3 days. If you do not have a bowel movement for 3 days, call your doctor or health care professional. Do not take Tylenol (acetaminophen) or medicines that have acetaminophen with this medicine. Too much acetaminophen can be very dangerous. Many nonprescription medicines contain acetaminophen. Always  read the labels carefully to avoid taking more acetaminophen. What side effects may I notice from receiving this medicine? Side effects that you should report to your doctor or health care professional as soon as possible: -allergic reactions like skin rash, itching or hives, swelling of the face, lips, or tongue -breathing difficulties, wheezing -confusion -light headedness or fainting spells -  severe stomach pain -unusually weak or tired -yellowing of the skin or the whites of the eyes Side effects that usually do not require medical attention (report to your doctor or health care professional if they continue or are bothersome): -dizziness -drowsiness -nausea -vomiting This list may not describe all possible side effects. Call your doctor for medical advice about side effects. You may report side effects to FDA at 1-800-FDA-1088. Where should I keep my medicine? Keep out of the reach of children. This medicine can be abused. Keep your medicine in a safe place to protect it from theft. Do not share this medicine with anyone. Selling or giving away this medicine is dangerous and against the law. Store at room temperature between 20 and 25 degrees C (68 and 77 degrees F). Keep container tightly closed. Protect from light. This medicine may cause accidental overdose and death if it is taken by other adults, children, or pets. Flush any unused medicine down the toilet to reduce the chance of harm. Do not use the medicine after the expiration date. NOTE: This sheet is a summary. It may not cover all possible information. If you have questions about this medicine, talk to your doctor, pharmacist, or health care provider.  2015, [employer]. (2012-08-24 13:17:35)
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
## 2013-09-06 NOTE — Progress Notes (Signed)
Orthopedic Tech Progress Note Patient Details:  Maurice Sims [birth year] [id number]  Ortho Devices Type of Ortho Device: Arm sling Ortho Device/Splint Location: rue Ortho Device/Splint Interventions: Application Made level 2 trauma visit  [name] 09/06/2013, 8:16 PM

## 2013-09-07 ENCOUNTER — Telehealth: Payer: Self-pay | Admitting: Family Medicine

## 2013-09-08 ENCOUNTER — Other Ambulatory Visit: Payer: Self-pay | Admitting: Family Medicine

## 2013-09-09 NOTE — Telephone Encounter (Signed)
Ok to refill??  Last office visit 08/04/2013.  Last refill 05/18/2013, #2 refills.

## 2013-09-09 NOTE — Telephone Encounter (Signed)
ok 

## 2013-09-09 NOTE — Telephone Encounter (Signed)
Medication called to pharmacy. 

## 2013-10-16 ENCOUNTER — Other Ambulatory Visit: Payer: Self-pay | Admitting: Family Medicine

## 2013-10-18 NOTE — Telephone Encounter (Signed)
Okay to refill change to 60 tablets 1 refill---  Not the 180 tablets

## 2013-10-18 NOTE — Telephone Encounter (Signed)
Ok to refill??  Last office visit 08/04/2013.  Last refill 09/09/2013.

## 2013-10-18 NOTE — Telephone Encounter (Signed)
Medication called to pharmacy. 

## 2013-11-09 ENCOUNTER — Other Ambulatory Visit: Payer: Self-pay | Admitting: Family Medicine

## 2013-12-28 ENCOUNTER — Encounter: Payer: Self-pay | Admitting: Family Medicine

## 2013-12-28 ENCOUNTER — Ambulatory Visit (INDEPENDENT_AMBULATORY_CARE_PROVIDER_SITE_OTHER): Payer: 59 | Admitting: Family Medicine

## 2013-12-28 VITALS — BP 118/90 | HR 82 | Temp 97.8°F | Resp 18 | Ht 69.0 in | Wt 189.0 lb

## 2013-12-28 DIAGNOSIS — M25519 Pain in unspecified shoulder: Secondary | ICD-10-CM | POA: Insufficient documentation

## 2013-12-28 DIAGNOSIS — M25511 Pain in right shoulder: Secondary | ICD-10-CM

## 2013-12-28 DIAGNOSIS — Z0000 Encounter for general adult medical examination without abnormal findings: Secondary | ICD-10-CM

## 2013-12-28 LAB — CBC WITH DIFFERENTIAL/PLATELET
Basophils Absolute: 0 10*3/uL (ref 0.0–0.1)
Basophils Relative: 0 % (ref 0–1)
EOS PCT: 1 % (ref 0–5)
Eosinophils Absolute: 0.1 10*3/uL (ref 0.0–0.7)
HCT: 42.4 % (ref 39.0–52.0)
Hemoglobin: 14.5 g/dL (ref 13.0–17.0)
LYMPHS ABS: 2.1 10*3/uL (ref 0.7–4.0)
Lymphocytes Relative: 37 % (ref 12–46)
MCH: 29.5 pg (ref 26.0–34.0)
MCHC: 34.2 g/dL (ref 30.0–36.0)
MCV: 86.2 fL (ref 78.0–100.0)
MONOS PCT: 10 % (ref 3–12)
MPV: 10.4 fL (ref 9.4–12.4)
Monocytes Absolute: 0.6 10*3/uL (ref 0.1–1.0)
Neutro Abs: 3 10*3/uL (ref 1.7–7.7)
Neutrophils Relative %: 52 % (ref 43–77)
PLATELETS: 219 10*3/uL (ref 150–400)
RBC: 4.92 MIL/uL (ref 4.22–5.81)
RDW: 14.2 % (ref 11.5–15.5)
WBC: 5.7 10*3/uL (ref 4.0–10.5)

## 2013-12-28 LAB — COMPREHENSIVE METABOLIC PANEL
ALT: 34 U/L (ref 0–53)
AST: 30 U/L (ref 0–37)
Albumin: 4.6 g/dL (ref 3.5–5.2)
Alkaline Phosphatase: 73 U/L (ref 39–117)
BUN: 13 mg/dL (ref 6–23)
CO2: 28 meq/L (ref 19–32)
CREATININE: 1.12 mg/dL (ref 0.50–1.35)
Calcium: 10 mg/dL (ref 8.4–10.5)
Chloride: 104 mEq/L (ref 96–112)
GLUCOSE: 80 mg/dL (ref 70–99)
Potassium: 4.8 mEq/L (ref 3.5–5.3)
Sodium: 140 mEq/L (ref 135–145)
Total Bilirubin: 0.5 mg/dL (ref 0.2–1.2)
Total Protein: 7.2 g/dL (ref 6.0–8.3)

## 2013-12-28 LAB — LIPID PANEL
CHOL/HDL RATIO: 4.2 ratio
CHOLESTEROL: 197 mg/dL (ref 0–200)
HDL: 47 mg/dL (ref >39)
LDL Cholesterol: 125 mg/dL — ABNORMAL HIGH (ref 0–99)
Triglycerides: 126 mg/dL (ref <150)
VLDL: 25 mg/dL (ref 0–40)

## 2013-12-28 MED ORDER — HYDROCODONE-ACETAMINOPHEN 5-325 MG PO TABS: 1.0000 | ORAL_TABLET | Freq: Four times a day (QID) | ORAL | Status: DC | PRN
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
## 2013-12-28 NOTE — Patient Instructions (Signed)
I recommend eye visit once a year I recommend dental visit every 6 months Goal is to  Exercise 30 minutes 5 days a week We will send a letter with lab results  F/U as needed or in 1 year
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
## 2014-03-11 ENCOUNTER — Telehealth: Payer: Self-pay | Admitting: Family Medicine

## 2014-03-11 NOTE — Telephone Encounter (Signed)
[phone]  PT states he received a letter from [facility] saying they will not cover the Azelastine-Fluticasone (DYMISTA) 137-50 MCG/ACT SUSP and is wanting something else that they will cover (says something about a prior auth at bottom of this letter and he is questioning that can you please call him)  [facility] [place]

## 2014-03-11 NOTE — Telephone Encounter (Signed)
Received request from pharmacy for PA on Dymista.  PA submitted.   Dx: seasonal allergies

## 2014-03-30 ENCOUNTER — Telehealth: Payer: Self-pay | Admitting: *Deleted

## 2014-03-30 NOTE — Telephone Encounter (Signed)
Pt called inquiring about his medication DYMISTA stated he spoke to [place] and was going to submit it thru his insurance never heard anything about it. Wants to know status of PA.

## 2014-03-31 NOTE — Telephone Encounter (Signed)
Per prior note, PA was submitted and is awaiting approval.

## 2014-04-15 ENCOUNTER — Telehealth: Payer: Self-pay | Admitting: Family Medicine

## 2014-04-15 ENCOUNTER — Other Ambulatory Visit: Payer: Self-pay | Admitting: Family Medicine

## 2014-04-15 MED ORDER — AZELASTINE-FLUTICASONE 137-50 MCG/ACT NA SUSP: 1.0000 | Freq: Every day | NASAL | Status: DC | PRN

## 2014-04-15 NOTE — Telephone Encounter (Signed)
Pt still waiting on response about Dymista.  Sent to [employer] a month ago.  Please call him.

## 2014-04-15 NOTE — Telephone Encounter (Signed)
[facility] [place]. [employer] [place] does not handle Rx benefits.   Call placed to patient. Was advised that [facility] is noted on insurance card.   PA submitted to [facility]. Was advised that Rx benefits are handled by [facility] [place] [phone].  PA submitted via phone and approved 04/15/2014- 04/14/2016.  Case ID: [id number].

## 2014-04-18 NOTE — Telephone Encounter (Signed)
Ok to refill??  Last office visit 12/28/2013.  Last refill 10/18/2013, #1 refill.

## 2014-04-18 NOTE — Telephone Encounter (Signed)
Okay to refill? 

## 2014-04-19 NOTE — Telephone Encounter (Signed)
RX called in .

## 2014-04-26 ENCOUNTER — Ambulatory Visit (INDEPENDENT_AMBULATORY_CARE_PROVIDER_SITE_OTHER): Payer: BLUE CROSS/BLUE SHIELD | Admitting: Family Medicine

## 2014-04-26 ENCOUNTER — Encounter: Payer: Self-pay | Admitting: Family Medicine

## 2014-04-26 VITALS — BP 132/78 | HR 76 | Temp 98.3°F | Resp 12 | Ht 69.0 in | Wt 190.0 lb

## 2014-04-26 DIAGNOSIS — J0100 Acute maxillary sinusitis, unspecified: Secondary | ICD-10-CM

## 2014-04-26 DIAGNOSIS — G4700 Insomnia, unspecified: Secondary | ICD-10-CM

## 2014-04-26 DIAGNOSIS — F5104 Psychophysiologic insomnia: Secondary | ICD-10-CM | POA: Insufficient documentation

## 2014-04-26 MED ORDER — AMOXICILLIN 500 MG PO CAPS: 500.0000 mg | ORAL_CAPSULE | Freq: Three times a day (TID) | ORAL | Status: DC

## 2014-04-26 NOTE — Progress Notes (Signed)
Patient ID: Maurice Sims, male   DOB: 03-25-1970, 44 y.o.   MRN: [id number]   Subjective:    Patient ID: Maurice GraterChristopher D Sims, male    DOB: 03-25-1970, 44 y.o.   MRN: [id number]  Patient presents for Illness   Pt here with sinus drainage and pressure worsening over past week, started as sore throat, had mild fever for 24 hours then resolved. Now has cough but little clear draiange. Using dymista and nasal saline. No wheeze, no SOB     Review Of Systems:  GEN- denies fatigue, +fever, weight loss,weakness, recent illness HEENT- denies eye drainage, change in vision,+ nasal discharge, CVS- denies chest pain, palpitations RESP- denies SOB, +cough, wheeze ABD- denies N/V, change in stools, abd pain GU- denies dysuria, hematuria, dribbling, incontinence MSK- denies joint pain, muscle aches, injury Neuro- denies headache, dizziness, syncope, seizure activity       Objective:    BP 132/78 mmHg  Pulse 76  Temp(Src) 98.3 F (36.8 C) (Oral)  Resp 12  Ht 5\' 9"  (1.753 m)  Wt 190 lb (86.183 kg)  BMI 28.05 kg/m2 GEN- NAD, alert and oriented x3 HEENT- PERRL, EOMI, non injected sclera, pink conjunctiva, MMM, oropharynx mild injection, TM clear bilat no effusion,  + maxillary sinus tenderness, inflammed turbinates,  Nasal drainage  Neck- Supple, no LAD CVS- RRR, no murmur RESP-CTAB EXT- No edema Pulses- Radial 2+         Assessment & Plan:      Problem List Items Addressed This Visit    None    Visit Diagnoses    Acute maxillary sinusitis, recurrence not specified    -  Primary    Treat with amoxicillin, continue allergy meds, nasal saline, decongestant as needed    Relevant Medications    amoxicillin (AMOXIL) capsule       Note: This dictation was prepared with Dragon dictation along with smaller phrase technology. Any transcriptional errors that result from this process are unintentional.

## 2014-04-26 NOTE — Patient Instructions (Signed)
Take antibiotics as prescribed  Continue current medications F/U as needed

## 2014-04-26 NOTE — Assessment & Plan Note (Signed)
He is starting to taper off his klonopin and see he will do without meds, currently down to 1mg  at bedtime, next step 0.5mg , when he is running low will decrease to 0.5mg 

## 2014-06-14 ENCOUNTER — Telehealth: Payer: Self-pay | Admitting: Family Medicine

## 2014-06-14 NOTE — Telephone Encounter (Signed)
Noted,possibel viral etiology, agree with above OV is not improved

## 2014-06-14 NOTE — Telephone Encounter (Signed)
Patient calling to ask some questions about his sickness he had overnight  [phone]

## 2014-06-14 NOTE — Telephone Encounter (Signed)
Returned call to patient.   Reports that he awoke last night with severe diarrhea and vomiting x3. States that he has remained nauseated, but has finally been able to keep some ginger ale down.   Reports that he has also been dizzy and weak and feels like his blood sugar is dropping. Advised to increase rest and increase fluid intake. Advised to increase diet as tolerated. Advised that if he continues to have issues past Thursday, to contact office for OV.

## 2014-06-16 ENCOUNTER — Ambulatory Visit (INDEPENDENT_AMBULATORY_CARE_PROVIDER_SITE_OTHER): Payer: BLUE CROSS/BLUE SHIELD | Admitting: Physician Assistant

## 2014-06-16 ENCOUNTER — Encounter: Payer: Self-pay | Admitting: Physician Assistant

## 2014-06-16 VITALS — BP 130/90 | HR 70 | Temp 98.4°F | Resp 19 | Wt 183.0 lb

## 2014-06-16 DIAGNOSIS — A084 Viral intestinal infection, unspecified: Secondary | ICD-10-CM | POA: Diagnosis not present

## 2014-06-16 NOTE — Progress Notes (Signed)
Patient ID: TYHIR SCHWAN MRN: [id number], DOB: May 17, 1970, 44 y.o. Date of Encounter: 06/16/2014, 10:39 AM    Chief Complaint:  Chief Complaint  Patient presents with  . Fever, vomit    monday     HPI: 44 y.o. year old white male reports that he always goes to bed early because of his work hours. That on Monday night which was 06/13/14 he went to bed around 9 PM. At about midnight he woke up with vomiting and diarrhea. He continued to have episodes of vomiting and diarrhea all night until daylight. On Tuesday he had about 2 episodes of vomiting. Has had no further vomiting since then. However he has continued to have diarrhea. Says that he thinks that the amount of diarrhea has some to do with him eating some food. Says that yesterday he did eat a little bit more. Says he's eaten a half a can of soup on 2 occasions. Also has eaten saltines Triscuits and toast. Says that the diarrhea continued yesterday and some last night. States that he is not feeling really lightheaded or presyncope. T-Max was on Tuesday night at 102. States that he has been afebrile since then and is afebrile today without any medication. Says that his whole abdomen has felt gurgly and crampy but he has had no 1 area of localized/focal pain.     Home Meds:   Outpatient Prescriptions Prior to Visit  Medication Sig Dispense Refill  . b complex vitamins tablet Take 1 tablet by mouth daily.    . clonazePAM (KLONOPIN) 1 MG tablet TAKE 1 TABLET BY MOUTH TWICE DAILY AS NEEDED FOR ANXIETY 60 tablet 0  . Multiple Vitamin (MULITIVITAMIN WITH MINERALS) TABS Take 1 tablet by mouth daily.    Marland Kitchen amoxicillin (AMOXIL) 500 MG capsule Take 1 capsule (500 mg total) by mouth 3 (three) times daily. (Patient not taking: Reported on 06/16/2014) 30 capsule 0  . Azelastine-Fluticasone (DYMISTA) 137-50 MCG/ACT SUSP Place 1 spray into both nostrils daily as needed (for congestion). (Patient not taking: Reported on 06/16/2014) 1 Bottle 11    No facility-administered medications prior to visit.    Allergies:  Allergies  Allergen Reactions  . Flagyl [Metronidazole] Other (See Comments)    Abdominal pain, back pain      Review of Systems: See HPI for pertinent ROS. All other ROS negative.    Physical Exam: Blood pressure 130/90, pulse 70, temperature 98.4 F (36.9 C), temperature source Oral, resp. rate 19, weight 183 lb (83.008 kg)., Body mass index is 27.01 kg/(m^2). General:  WNWD WM. Appears in no acute distress. Neck: Supple. No thyromegaly. No lymphadenopathy. Lungs: Clear bilaterally to auscultation without wheezes, rales, or rhonchi. Breathing is unlabored. Heart: Regular rhythm. No murmurs, rubs, or gallops. Abdomen: Soft, non-tender, non-distended with normoactive bowel sounds. No hepatomegaly. No rebound/guarding. No obvious abdominal masses. No area of localized/focal abdominal pain with palpation. He states that he has had diverticulitis in the past and has had no pain like that, discomfort with this illness.  Msk:  Strength and tone normal for age. Extremities/Skin: Warm and dry.  Neuro: Alert and oriented X 3. Moves all extremities spontaneously. Gait is normal. CNII-XII grossly in tact. Psych:  Responds to questions appropriately with a normal affect.     ASSESSMENT AND PLAN:  44 y.o. year old male with  1. Viral gastroenteritis Discussed drinking small sips of fluid as frequently as possible to prevent dehydration. Also can use over-the-counter Imodium to decrease the diarrhea. Continue  bland diet as he is. Letter given for out of work to use day 06/14/14 through Friday 06/17/14. Follow-up if symptoms worsen or do not come to need to improve and resolve over the next few days.    Signed, [address], [place], [facility] 06/16/2014 10:39 AM

## 2014-10-12 ENCOUNTER — Encounter: Payer: Self-pay | Admitting: Physician Assistant

## 2014-10-12 ENCOUNTER — Ambulatory Visit (INDEPENDENT_AMBULATORY_CARE_PROVIDER_SITE_OTHER): Payer: BLUE CROSS/BLUE SHIELD | Admitting: Physician Assistant

## 2014-10-12 VITALS — BP 122/86 | HR 80 | Temp 98.6°F | Resp 18 | Wt 191.0 lb

## 2014-10-12 DIAGNOSIS — J988 Other specified respiratory disorders: Secondary | ICD-10-CM

## 2014-10-12 DIAGNOSIS — B9689 Other specified bacterial agents as the cause of diseases classified elsewhere: Secondary | ICD-10-CM

## 2014-10-12 DIAGNOSIS — G4700 Insomnia, unspecified: Secondary | ICD-10-CM

## 2014-10-12 DIAGNOSIS — J019 Acute sinusitis, unspecified: Secondary | ICD-10-CM | POA: Diagnosis not present

## 2014-10-12 MED ORDER — TRAZODONE HCL 50 MG PO TABS: 50.0000 mg | ORAL_TABLET | Freq: Every evening | ORAL | Status: DC | PRN

## 2014-10-12 MED ORDER — PREDNISONE 20 MG PO TABS: ORAL_TABLET | ORAL | Status: DC

## 2014-10-12 MED ORDER — AZITHROMYCIN 250 MG PO TABS: ORAL_TABLET | ORAL | Status: DC

## 2014-10-12 NOTE — Progress Notes (Signed)
Patient ID: Maurice Sims MRN: [id number], DOB: 1970-09-29, 44 y.o. Date of Encounter: 10/12/2014, 10:31 AM    Chief Complaint:  Chief Complaint  Patient presents with  . sinus infection x 1 week    OTC's not helping     HPI: 44 y.o. year old white male reports that he has been sick for about 14 days now. That it started with a sore throat then went to his chest says that it stayed in his chest for a week but then the chest has improved. Says now he has a little bit of congestion in his chest but mostly all in his head is definitely the worst now. Cut feels a lot of pressure in his head. Says a little drainage comes out of his nose. Has had no fevers or chills. Has a little bit of a scratchy throat has returned but nowhere near as sore as it was in the beginning.  Says that he has weaned himself off of Klonopin. Says he is not getting good sleep. Says in the past he has tried melatonin and says years ago he used Ambien some but says he's always had some problems with sleepwalking and had problems with that when on Ambien. However says that when he was on Ambien he was also taking the benzodiazepine at that time doesn't know if that was contributing to those issues. Says that he mostly has problems staying asleep. Feels like he is constantly repeatedly waking up throughout the night and never getting a good rest.     Home Meds:   Outpatient Prescriptions Prior to Visit  Medication Sig Dispense Refill  . Azelastine-Fluticasone (DYMISTA) 137-50 MCG/ACT SUSP Place 1 spray into both nostrils daily as needed (for congestion). 1 Bottle 11  . b complex vitamins tablet Take 1 tablet by mouth daily.    . Multiple Vitamin (MULITIVITAMIN WITH MINERALS) TABS Take 1 tablet by mouth daily.    . clonazePAM (KLONOPIN) 1 MG tablet TAKE 1 TABLET BY MOUTH TWICE DAILY AS NEEDED FOR ANXIETY (Patient not taking: Reported on 10/12/2014) 60 tablet 0  . amoxicillin (AMOXIL) 500 MG capsule Take 1  capsule (500 mg total) by mouth 3 (three) times daily. (Patient not taking: Reported on 06/16/2014) 30 capsule 0   No facility-administered medications prior to visit.    Allergies:  Allergies  Allergen Reactions  . Flagyl [Metronidazole] Other (See Comments)    Abdominal pain, back pain      Review of Systems: See HPI for pertinent ROS. All other ROS negative.    Physical Exam: Blood pressure 122/86, pulse 80, temperature 98.6 F (37 C), temperature source Oral, resp. rate 18, weight 191 lb (86.637 kg)., Body mass index is 28.19 kg/(m^2). General:  WNWD WM. Appears in no acute distress. HEENT: Normocephalic, atraumatic, eyes without discharge, sclera non-icteric, nares are without discharge. Bilateral auditory canals clear, TM's are without perforation, pearly grey and translucent with reflective cone of light bilaterally. Oral cavity moist, posterior pharynx with mild erythema. No exudate, no peritonsillar abscess. There is tenderness with percussion of bilateral maxillary sinuses; minimal tenderness with percussion of frontal sinuses. Neck: Supple. No thyromegaly. No lymphadenopathy. Lungs: Clear bilaterally to auscultation without wheezes, rales, or rhonchi. Breathing is unlabored. Heart: Regular rhythm. No murmurs, rubs, or gallops. Msk:  Strength and tone normal for age. Extremities/Skin: Warm and dry. Neuro: Alert and oriented X 3. Moves all extremities spontaneously. Gait is normal. CNII-XII grossly in tact. Psych:  Responds to questions appropriately with a  normal affect.     ASSESSMENT AND PLAN:  44 y.o. year old male with  1. Bacterial respiratory infection - azithromycin (ZITHROMAX) 250 MG tablet; Day 1: Take 2 daily. Days 2 - 5: Take 1 daily.  Dispense: 6 tablet; Refill: 0 - predniSONE (DELTASONE) 20 MG tablet; Take 3 daily for 2 days, then 2 daily for 2 days, then 1 daily for 2 days.  Dispense: 12 tablet; Refill: 0  2. Acute sinusitis, recurrence not specified,  unspecified location - predniSONE (DELTASONE) 20 MG tablet; Take 3 daily for 2 days, then 2 daily for 2 days, then 1 daily for 2 days.  Dispense: 12 tablet; Refill: 0  3. Insomnia - traZODone (DESYREL) 50 MG tablet; Take 1 tablet (50 mg total) by mouth at bedtime as needed for sleep.  Dispense: 30 tablet; Refill: 3  For the respiratory infection/sinus infection he is to take the antibiotic and prednisone as directed. I'll up if symptoms do not resolve after completion of these. For the insomnia he is to take the trazodone about 30 minutes prior to going to bed. I'll up with [place] if this does not work for him or if it causes adverse effects.  Signed, [address] [place], [place], [facility] 10/12/2014 10:31 AM

## 2014-11-08 ENCOUNTER — Other Ambulatory Visit: Payer: Self-pay | Admitting: Family Medicine

## 2014-11-08 NOTE — Telephone Encounter (Signed)
Ok to refill??  Last office visit 10/12/2014.  Last refill 04/19/2014.

## 2014-11-08 NOTE — Telephone Encounter (Signed)
Okay to refill? 

## 2014-11-08 NOTE — Telephone Encounter (Signed)
Medication refilled per protocol. 

## 2014-11-10 ENCOUNTER — Encounter: Payer: Self-pay | Admitting: Physician Assistant

## 2014-11-10 ENCOUNTER — Ambulatory Visit (INDEPENDENT_AMBULATORY_CARE_PROVIDER_SITE_OTHER): Payer: BLUE CROSS/BLUE SHIELD | Admitting: Physician Assistant

## 2014-11-10 VITALS — BP 122/86 | HR 80 | Temp 98.5°F | Resp 18 | Wt 190.0 lb

## 2014-11-10 DIAGNOSIS — R079 Chest pain, unspecified: Secondary | ICD-10-CM | POA: Diagnosis not present

## 2014-11-10 DIAGNOSIS — B360 Pityriasis versicolor: Secondary | ICD-10-CM

## 2014-11-10 MED ORDER — KETOCONAZOLE 2 % EX SHAM: 1.0000 "application " | MEDICATED_SHAMPOO | CUTANEOUS | Status: DC

## 2014-11-10 NOTE — Progress Notes (Signed)
Patient ID: Maurice Sims MRN: [id number], DOB: 29-Dec-1970, 44 y.o. Date of Encounter: 11/10/2014, 3:33 PM    Chief Complaint:  Chief Complaint  Patient presents with  . pain throught back and chest last Friday    had had spicey, fatty meal prior     HPI: 44 y.o. year old white male reports that this past Friday night which was October 21st-- he had eaten [place] food and had some alcoholic beverages.   After that, he was sitting in bed and felt a discomfort that was across both sides of his chest.  Says that it seemed to be a mix of 2 different sensations. One sensation seemed as if there was something pressing across his back and across his chest-- pressing these areas together.  It was another sensation that was sharp pain across both sides of his chest. says that he tried to relax and take a deep breath and it eventually eased. Says that there was no discomfort up his neck or down his arm. No shortness of breath. No diaphoresis nausea or vomiting.  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 Says he does all types of maintenance and electrical work.  Asked if he had done a lot of heavy lifting or overhead work but he says none in particular that he can recall but in general sometimes does have to do this type of work. Says that he has not been lifting any weights or doing any pushups.  He says that he does go to the park and walks 4 or 5 miles on a routine basis and was doing this last week with no problems. No angina no increased shortness of breath no palpitations with this. Also, at work, has to walk significant distance--and is had no angina symptoms with this. No type of chest discomfort and no increased shortness of breath/dyspnea on exertion.  Says that after this episode occurred on Friday, on that Sunday he was working on his deck and had no problems.  Says that he did take PPIs in the past but then heard about side effects of taking these long-term so he  stopped them. Says he may be over-reacting but wanted to get this checked.      Home Meds:   Outpatient Prescriptions Prior to Visit  Medication Sig Dispense Refill  . b complex vitamins tablet Take 1 tablet by mouth daily.    . clonazePAM (KLONOPIN) 1 MG tablet TAKE 1 TABLET BY MOUTH TWICE DAILY AS NEEDED 60 tablet 0  . Multiple Vitamin (MULITIVITAMIN WITH MINERALS) TABS Take 1 tablet by mouth daily.    . Azelastine-Fluticasone (DYMISTA) 137-50 MCG/ACT SUSP Place 1 spray into both nostrils daily as needed (for congestion). (Patient not taking: Reported on 11/10/2014) 1 Bottle 11  . traZODone (DESYREL) 50 MG tablet Take 1 tablet (50 mg total) by mouth at bedtime as needed for sleep. (Patient not taking: Reported on 11/10/2014) 30 tablet 3  . azithromycin (ZITHROMAX) 250 MG tablet Day 1: Take 2 daily. Days 2 - 5: Take 1 daily. 6 tablet 0  . predniSONE (DELTASONE) 20 MG tablet Take 3 daily for 2 days, then 2 daily for 2 days, then 1 daily for 2 days. 12 tablet 0   No facility-administered medications prior to visit.    Allergies:  Allergies  Allergen Reactions  . Flagyl [Metronidazole] Other (See Comments)    Abdominal pain, back pain      Review of Systems: See HPI for pertinent ROS. All  other ROS negative.    Physical Exam: Blood pressure 122/86, pulse 80, temperature 98.5 F (36.9 C), temperature source Oral, resp. rate 18, weight 190 lb (86.183 kg)., Body mass index is 28.05 kg/(m^2). General:  WNWD WM. Appears in no acute distress. Neck: Supple. No thyromegaly. No lymphadenopathy. Lungs: Clear bilaterally to auscultation without wheezes, rales, or rhonchi. Breathing is unlabored. Heart: Regular rhythm. No murmurs, rubs, or gallops. No area of tenderness with palpation of entire chest wall, including peristernal region.  Msk:  Strength and tone normal for age. Extremities/Skin: Warm and dry. Back: Areas of hypopigmentation Neuro: Alert and oriented X 3. Moves all  extremities spontaneously. Gait is normal. CNII-XII grossly in tact. Psych:  Responds to questions appropriately with a normal affect.     ASSESSMENT AND PLAN:  44 y.o. year old male with  1. Chest pain, unspecified chest pain type  EKG shows normal sinus rhythm with nonspecific ST-T changes.  Suspect that the symptoms he experienced Friday night were secondary to GERD. Told him to follow-up if he has any recurrent similar symptoms.  He also request medication to use for tinea versicolor. States that he has had multiple different treatments in the past including Diflucan and Nizoral creams. I sent prescription for ketoconazole shampoo. He is to apply for 5 minutes prior to rinsing.  1. Chest pain, unspecified chest pain type - EKG 12-Lead  2. Tinea versicolor - ketoconazole (NIZORAL) 2 % shampoo; Apply 1 application topically 2 (two) times a week.  Dispense: 120 mL; Refill: 0     Signed, [address] [place], [place], [facility] 11/10/2014 3:33 PM

## 2014-12-30 ENCOUNTER — Other Ambulatory Visit: Payer: BLUE CROSS/BLUE SHIELD

## 2014-12-30 DIAGNOSIS — Z0000 Encounter for general adult medical examination without abnormal findings: Secondary | ICD-10-CM

## 2014-12-30 DIAGNOSIS — Z79899 Other long term (current) drug therapy: Secondary | ICD-10-CM

## 2014-12-30 DIAGNOSIS — G4700 Insomnia, unspecified: Secondary | ICD-10-CM

## 2014-12-30 LAB — CBC WITH DIFFERENTIAL/PLATELET
Basophils Absolute: 0 10*3/uL (ref 0.0–0.1)
Basophils Relative: 0 % (ref 0–1)
Eosinophils Absolute: 0.1 10*3/uL (ref 0.0–0.7)
Eosinophils Relative: 1 % (ref 0–5)
HCT: 45 % (ref 39.0–52.0)
HEMOGLOBIN: 15.2 g/dL (ref 13.0–17.0)
LYMPHS ABS: 2.1 10*3/uL (ref 0.7–4.0)
Lymphocytes Relative: 38 % (ref 12–46)
MCH: 29.5 pg (ref 26.0–34.0)
MCHC: 33.8 g/dL (ref 30.0–36.0)
MCV: 87.4 fL (ref 78.0–100.0)
MONO ABS: 0.4 10*3/uL (ref 0.1–1.0)
MPV: 10.9 fL (ref 8.6–12.4)
Monocytes Relative: 7 % (ref 3–12)
Neutro Abs: 3 10*3/uL (ref 1.7–7.7)
Neutrophils Relative %: 54 % (ref 43–77)
Platelets: 225 10*3/uL (ref 150–400)
RBC: 5.15 MIL/uL (ref 4.22–5.81)
RDW: 13.6 % (ref 11.5–15.5)
WBC: 5.6 10*3/uL (ref 4.0–10.5)

## 2014-12-30 LAB — COMPLETE METABOLIC PANEL WITH GFR
ALT: 36 U/L (ref 9–46)
AST: 31 U/L (ref 10–40)
Albumin: 4.4 g/dL (ref 3.6–5.1)
Alkaline Phosphatase: 56 U/L (ref 40–115)
BILIRUBIN TOTAL: 0.4 mg/dL (ref 0.2–1.2)
BUN: 17 mg/dL (ref 7–25)
CO2: 27 mmol/L (ref 20–31)
CREATININE: 1.19 mg/dL (ref 0.60–1.35)
Calcium: 9.6 mg/dL (ref 8.6–10.3)
Chloride: 104 mmol/L (ref 98–110)
GFR, Est African American: 85 mL/min (ref >=60)
GFR, Est Non African American: 73 mL/min (ref >=60)
GLUCOSE: 79 mg/dL (ref 70–99)
Potassium: 4.4 mmol/L (ref 3.5–5.3)
SODIUM: 140 mmol/L (ref 135–146)
TOTAL PROTEIN: 6.9 g/dL (ref 6.1–8.1)

## 2014-12-30 LAB — LIPID PANEL
CHOL/HDL RATIO: 4.3 ratio (ref <=5.0)
Cholesterol: 198 mg/dL (ref 125–200)
HDL: 46 mg/dL (ref >=40)
LDL CALC: 137 mg/dL — AB (ref <130)
Triglycerides: 73 mg/dL (ref <150)
VLDL: 15 mg/dL (ref <30)

## 2014-12-30 LAB — TSH: TSH: 2.244 u[IU]/mL (ref 0.350–4.500)

## 2015-01-02 ENCOUNTER — Encounter: Payer: Self-pay | Admitting: Family Medicine

## 2015-01-02 ENCOUNTER — Ambulatory Visit (INDEPENDENT_AMBULATORY_CARE_PROVIDER_SITE_OTHER): Payer: BLUE CROSS/BLUE SHIELD | Admitting: Family Medicine

## 2015-01-02 VITALS — BP 116/80 | HR 82 | Temp 98.1°F | Resp 20 | Wt 184.0 lb

## 2015-01-02 DIAGNOSIS — G4700 Insomnia, unspecified: Secondary | ICD-10-CM | POA: Diagnosis not present

## 2015-01-02 DIAGNOSIS — Z0000 Encounter for general adult medical examination without abnormal findings: Secondary | ICD-10-CM

## 2015-01-02 DIAGNOSIS — F411 Generalized anxiety disorder: Secondary | ICD-10-CM | POA: Diagnosis not present

## 2015-01-02 DIAGNOSIS — F5104 Psychophysiologic insomnia: Secondary | ICD-10-CM

## 2015-01-02 NOTE — Patient Instructions (Signed)
Continue current medications  F/U as needed or in 1 year for physical

## 2015-01-02 NOTE — Assessment & Plan Note (Signed)
Continue klonopin , will try to use 1/2 tablet BID

## 2015-01-02 NOTE — Progress Notes (Signed)
Patient ID: Maurice GraterChristopher D Keltner, male   DOB: [birth year], 44 y.o.   MRN: [id number]   Subjective:    Patient ID: Maurice GraterChristopher D Sims, male    DOB: [birth year], 44 y.o.   MRN: [id number]  Patient presents for Annual Exam Pt here for CPE, Tetanus UTD, declines flu shot Has form for work Family history reviewed History of anxiety/insomnia- he tried weaning off his Klonopin but has withdrawels and increased anxiety, he tried Trazodone but this made him very fatigued and felt worse, he went back to Klonopin takes 1/2 to 1 tablet a day.   Fasting labs reviewed      Review Of Systems:  GEN- denies fatigue, fever, weight loss,weakness, recent illness HEENT- denies eye drainage, change in vision, nasal discharge, CVS- denies chest pain, palpitations RESP- denies SOB, cough, wheeze ABD- denies N/V, change in stools, abd pain GU- denies dysuria, hematuria, dribbling, incontinence MSK- denies joint pain, muscle aches, injury Neuro- denies headache, dizziness, syncope, seizure activity       Objective:    BP 116/80 mmHg  Pulse 82  Temp(Src) 98.1 F (36.7 C) (Oral)  Resp 20  Wt 184 lb (83.462 kg) GEN- NAD, alert and oriented x3 HEENT- PERRL, EOMI, non injected sclera, pink conjunctiva, MMM, oropharynx clear, TM clear bilat Neck- Supple, no thyromegaly CVS- RRR, no murmur RESP-CTAB ABD-NABS,soft,NT,ND Psych- normal affect and mood EXT- No edema Pulses- Radial, 2+        Assessment & Plan:      Problem List Items Addressed This Visit    GAD (generalized anxiety disorder)   Chronic insomnia    Continue klonopin , will try to use 1/2 tablet BID       Other Visit Diagnoses    Routine general medical examination at a health care facility    -  Primary    CPE done, Fasting labs reviewed, monitor cholesterol. Continue current meds       Note: This dictation was prepared with Dragon dictation along with smaller phrase technology. Any transcriptional errors that result  from this process are unintentional.

## 2015-01-31 ENCOUNTER — Other Ambulatory Visit: Payer: Self-pay | Admitting: Physician Assistant

## 2015-01-31 ENCOUNTER — Other Ambulatory Visit: Payer: Self-pay | Admitting: Family Medicine

## 2015-01-31 NOTE — Telephone Encounter (Signed)
Ok to refill??  Last office visit 01/02/2015.  Last refill 11/08/2014.

## 2015-01-31 NOTE — Telephone Encounter (Signed)
Medication called to pharmacy. 

## 2015-01-31 NOTE — Telephone Encounter (Signed)
Refill appropriate and filled per protocol. 

## 2015-01-31 NOTE — Telephone Encounter (Signed)
ok 

## 2015-02-08 ENCOUNTER — Encounter: Payer: Self-pay | Admitting: Physician Assistant

## 2015-02-08 ENCOUNTER — Ambulatory Visit (INDEPENDENT_AMBULATORY_CARE_PROVIDER_SITE_OTHER): Payer: BLUE CROSS/BLUE SHIELD | Admitting: Physician Assistant

## 2015-02-08 VITALS — BP 112/82 | HR 78 | Temp 98.3°F | Resp 18 | Wt 184.0 lb

## 2015-02-08 DIAGNOSIS — B9689 Other specified bacterial agents as the cause of diseases classified elsewhere: Secondary | ICD-10-CM

## 2015-02-08 DIAGNOSIS — J029 Acute pharyngitis, unspecified: Secondary | ICD-10-CM

## 2015-02-08 DIAGNOSIS — J988 Other specified respiratory disorders: Secondary | ICD-10-CM

## 2015-02-08 MED ORDER — AZITHROMYCIN 250 MG PO TABS: ORAL_TABLET | ORAL | Status: DC

## 2015-02-08 NOTE — Progress Notes (Signed)
    Patient ID: Maurice Sims MRN: [id number], DOB: 17-Mar-1970, 45 y.o. Date of Encounter: 02/08/2015, 10:24 AM    Chief Complaint:  Chief Complaint  Patient presents with  . OTHER    head congestion, bronchul issues  . Sore Throat    since friday, seems to get worse     HPI: 45 y.o. year old male presents with above. Is that symptoms started Friday 02/03/15 with sore throat. Says that since then all of the symptoms progressively worsened. A lot and nasal congestion with pressure from his nose to his ears bilaterally and getting thick yellow-green mucus from his nose. Also has cough. Does like this is secondary to drainage in his throat as well as congestion in the upper part of his chest. No fevers or chills.     Home Meds:   Outpatient Prescriptions Prior to Visit  Medication Sig Dispense Refill  . b complex vitamins tablet Take 1 tablet by mouth daily.    . clonazePAM (KLONOPIN) 1 MG tablet TAKE 1 TABLET BY MOUTH TWICE DAILY AS NEEDED 60 tablet 0  . ketoconazole (NIZORAL) 2 % shampoo APPLY EXTERNALLY 2 TIMES A WEEK 120 mL 0  . Multiple Vitamin (MULITIVITAMIN WITH MINERALS) TABS Take 1 tablet by mouth daily.     No facility-administered medications prior to visit.    Allergies:  Allergies  Allergen Reactions  . Flagyl [Metronidazole] Other (See Comments)    Abdominal pain, back pain      Review of Systems: See HPI for pertinent ROS. All other ROS negative.    Physical Exam: Blood pressure 112/82, pulse 78, temperature 98.3 F (36.8 C), temperature source Oral, resp. rate 18, weight 184 lb (83.462 kg)., Body mass index is 27.16 kg/(m^2). General:  WNWD WM. Appears in no acute distress. HEENT: Normocephalic, atraumatic, eyes without discharge, sclera non-icteric, nares are without discharge. Bilateral auditory canals clear, TM's are without perforation, pearly grey and translucent with reflective cone of light bilaterally. Oral cavity moist, posterior pharynx with  mild-mod erythema , without exudate, without peritonsillar abscess.  Neck: Supple. No thyromegaly. No lymphadenopathy. Lungs: Clear bilaterally to auscultation without wheezes, rales, or rhonchi. Breathing is unlabored. Heart: Regular rhythm. No murmurs, rubs, or gallops. Msk:  Strength and tone normal for age. Extremities/Skin: Warm and dry. Neuro: Alert and oriented X 3. Moves all extremities spontaneously. Gait is normal. CNII-XII grossly in tact. Psych:  Responds to questions appropriately with a normal affect.      ASSESSMENT AND PLAN:  45 y.o. year old male with  1. Bacterial respiratory infection I gave him samples of Norel AD to take as directed every 4-6 hours as needed for congestion symptoms. He is to start antibiotic immediately take as directed. All of his symptoms do not resolve within 1 week after completion of antibiotic. - azithromycin (ZITHROMAX) 250 MG tablet; Day 1: Take 2 daily. Days 2-5: Take 1 daily.  Dispense: 6 tablet; Refill: 0  2. Sore throat RST--Negative - Rapid Strep Screen   Signed, [address] [place], [place], [facility] 02/08/2015 10:24 AM

## 2015-02-13 ENCOUNTER — Telehealth: Payer: Self-pay | Admitting: Family Medicine

## 2015-02-13 ENCOUNTER — Other Ambulatory Visit: Payer: Self-pay | Admitting: Family Medicine

## 2015-02-13 MED ORDER — AMOXICILLIN-POT CLAVULANATE 875-125 MG PO TABS: 1.0000 | ORAL_TABLET | Freq: Two times a day (BID) | ORAL | Status: DC

## 2015-02-13 NOTE — Telephone Encounter (Signed)
LMTCB

## 2015-02-13 NOTE — Telephone Encounter (Signed)
Pt states still with a lot of congestions out of head.  Thick clear, yellowish, slight blood tinged.

## 2015-02-13 NOTE — Telephone Encounter (Signed)
Pt aware and script sent to pharmacy. 

## 2015-02-13 NOTE — Telephone Encounter (Signed)
Pt saw Shon Hale last week for illness and was prescribed a z-pack but is not feeling any better.  He would like to know if there is anything else he can take so that he does not have to come back in. Please advise [phone]                         [phone]

## 2015-02-13 NOTE — Telephone Encounter (Signed)
Forward to the provider last treated patient.

## 2015-02-13 NOTE — Telephone Encounter (Signed)
Augmentin 875+125----1 po BID x 10 days # 20 + 0. Let me know if symptoms do not resolve when this is completed.

## 2015-02-15 LAB — STREP GROUP A AG, W/REFLEX TO CULT: STREGTOCOCCUS GROUP A AG SCREEN: NOT DETECTED

## 2015-02-15 NOTE — Addendum Note (Signed)
Addended by: Alean Rinne A on: 02/15/2015 03:46 PM   Modules accepted: Orders

## 2015-07-04 ENCOUNTER — Encounter: Payer: Self-pay | Admitting: Family Medicine

## 2015-07-04 ENCOUNTER — Ambulatory Visit (INDEPENDENT_AMBULATORY_CARE_PROVIDER_SITE_OTHER): Payer: BLUE CROSS/BLUE SHIELD | Admitting: Family Medicine

## 2015-07-04 VITALS — BP 128/72 | HR 68 | Temp 98.2°F | Resp 14 | Ht 69.0 in | Wt 187.0 lb

## 2015-07-04 DIAGNOSIS — R5383 Other fatigue: Secondary | ICD-10-CM

## 2015-07-04 DIAGNOSIS — G4700 Insomnia, unspecified: Secondary | ICD-10-CM | POA: Diagnosis not present

## 2015-07-04 DIAGNOSIS — B360 Pityriasis versicolor: Secondary | ICD-10-CM

## 2015-07-04 DIAGNOSIS — F5104 Psychophysiologic insomnia: Secondary | ICD-10-CM

## 2015-07-04 LAB — CBC WITH DIFFERENTIAL/PLATELET
BASOS ABS: 60 {cells}/uL (ref 0–200)
Basophils Relative: 1 %
EOS ABS: 120 {cells}/uL (ref 15–500)
Eosinophils Relative: 2 %
HEMATOCRIT: 41.8 % (ref 38.5–50.0)
HEMOGLOBIN: 14.2 g/dL (ref 13.0–17.0)
LYMPHS ABS: 2040 {cells}/uL (ref 850–3900)
Lymphocytes Relative: 34 %
MCH: 29.7 pg (ref 27.0–33.0)
MCHC: 34 g/dL (ref 32.0–36.0)
MCV: 87.4 fL (ref 80.0–100.0)
MONO ABS: 540 {cells}/uL (ref 200–950)
MPV: 10.7 fL (ref 7.5–12.5)
Monocytes Relative: 9 %
NEUTROS PCT: 54 %
Neutro Abs: 3240 cells/uL (ref 1500–7800)
Platelets: 220 10*3/uL (ref 140–400)
RBC: 4.78 MIL/uL (ref 4.20–5.80)
RDW: 13.7 % (ref 11.0–15.0)
WBC: 6 10*3/uL (ref 3.8–10.8)

## 2015-07-04 MED ORDER — KETOCONAZOLE 2 % EX SHAM: MEDICATED_SHAMPOO | CUTANEOUS | Status: DC

## 2015-07-04 MED ORDER — SUVOREXANT 10 MG PO TABS: 1.0000 | ORAL_TABLET | Freq: Every evening | ORAL | Status: DC | PRN

## 2015-07-04 MED ORDER — CLONAZEPAM 1 MG PO TABS: 1.0000 mg | ORAL_TABLET | Freq: Two times a day (BID) | ORAL | Status: DC | PRN

## 2015-07-04 NOTE — Progress Notes (Signed)
Patient ID: Maurice Sims, male   DOB: 03-06-1970, 45 y.o.   MRN: [id number]   Subjective:    Patient ID: Maurice Sims, male    DOB: 03-06-1970, 45 y.o.   MRN: [id number]  Patient presents for Medication Review/ Refill Patient here to follow-up medications. He has no new concerns. He continues have difficulty with chronic insomnia. He is unable to sleep without the Klonopin which he takes at bedtime. He's tried multiple other medications in the past. He continues to use and is aroused for the recurrence of his tinea versicolor it is been well controlled with this Does say he will like his testosterone check he has some fatigue is had some low normal testosterone in the past. He has not had any problems with his injections or his libido   Review Of Systems:  GEN- denies fatigue, fever, weight loss,weakness, recent illness HEENT- denies eye drainage, change in vision, nasal discharge, CVS- denies chest pain, palpitations RESP- denies SOB, cough, wheeze ABD- denies N/V, change in stools, abd pain GU- denies dysuria, hematuria, dribbling, incontinence MSK- denies joint pain, muscle aches, injury Neuro- denies headache, dizziness, syncope, seizure activity       Objective:    BP 128/72 mmHg  Pulse 68  Temp(Src) 98.2 F (36.8 C) (Oral)  Resp 14  Ht 5\' 9"  (1.753 m)  Wt 187 lb (84.823 kg)  BMI 27.60 kg/m2 GEN- NAD, alert and oriented x3 HEENT- PERRL, EOMI, non injected sclera, pink conjunctiva, MMM, oropharynx clear Neck- Supple, no thyromegaly CVS- RRR, no murmur RESP-CTAB EXT- No edema Pulses- Radial, DP- 2+        Assessment & Plan:      Problem List Items Addressed This Visit    Tinea versicolor    Continue nizoral to keep down      Chronic insomnia    Trial of Belsomra 10mg  at bedtime        Other Visit Diagnoses    Other fatigue    -  Primary    check labs, recent TSH normal    Relevant Orders    Testosterone    CBC with  Differential/Platelet       Note: This dictation was prepared with Dragon dictation along with smaller phrase technology. Any transcriptional errors that result from this process are unintentional.

## 2015-07-04 NOTE — Patient Instructions (Signed)
F/U 6 months for PHYSICAL We will call with lab results Try the Belsomra

## 2015-07-04 NOTE — Assessment & Plan Note (Signed)
Continue nizoral to keep down

## 2015-07-04 NOTE — Assessment & Plan Note (Signed)
Trial of Belsomra 10mg  at bedtime

## 2015-07-05 LAB — TESTOSTERONE: Testosterone: 304 ng/dL (ref 250–827)

## 2015-10-23 ENCOUNTER — Encounter: Payer: Self-pay | Admitting: Family Medicine

## 2015-10-23 ENCOUNTER — Ambulatory Visit (INDEPENDENT_AMBULATORY_CARE_PROVIDER_SITE_OTHER): Payer: BLUE CROSS/BLUE SHIELD | Admitting: Family Medicine

## 2015-10-23 VITALS — BP 118/78 | HR 90 | Temp 99.0°F | Resp 16 | Wt 188.0 lb

## 2015-10-23 DIAGNOSIS — J039 Acute tonsillitis, unspecified: Secondary | ICD-10-CM

## 2015-10-23 DIAGNOSIS — J029 Acute pharyngitis, unspecified: Secondary | ICD-10-CM

## 2015-10-23 LAB — STREP GROUP A AG, W/REFLEX TO CULT: STREGTOCOCCUS GROUP A AG SCREEN: NOT DETECTED

## 2015-10-23 MED ORDER — AMOXICILLIN 875 MG PO TABS: 875.0000 mg | ORAL_TABLET | Freq: Two times a day (BID) | ORAL | 0 refills | Status: DC

## 2015-10-23 MED ORDER — CETIRIZINE HCL 10 MG PO TABS: 10.0000 mg | ORAL_TABLET | Freq: Every day | ORAL | 11 refills | Status: DC

## 2015-10-23 NOTE — Patient Instructions (Addendum)
Ibuprofen for pain  Salt water gargle  Take antibiotic  F/U as needed

## 2015-10-23 NOTE — Progress Notes (Signed)
   Subjective:    Patient ID: Maurice GraterChristopher D Staub, male    DOB: Sep 03, 1970, 45 y.o.   MRN: [id number]  Patient presents for Sore Throat (headache started this morning) Pt here with sore throat started yesteday , headache started this morning No fever. + sick contact He drank after his sister who was sick He had low-grade fever that started this morning 99.7F Took Sudafed to decongest as he felt some sinus pressure he is also taking Zyrtec regularly +pain with swallowing       Review Of Systems:  GEN- + fatigue, +fever, weight loss,weakness, recent illness HEENT- denies eye drainage, change in vision, nasal discharge, CVS- denies chest pain, palpitations RESP- denies SOB, cough, wheeze ABD- denies N/V, change in stools, abd pain GU- denies dysuria, hematuria, dribbling, incontinence MSK- denies joint pain, muscle aches, injury Neuro- denies headache, dizziness, syncope, seizure activity       Objective:    BP 118/78   Pulse 90   Temp 99 F (37.2 C) (Oral)   Resp 16   Wt 188 lb (85.3 kg)   SpO2 97%   BMI 27.76 kg/m  GEN- NAD, alert and oriented x3 HEENT- PERRL, EOMI, non injected sclera, pink conjunctiva, MMM, oropharynx mild injection, with mild exudates enlarged tonsils  TM clear bilat no effusion, no maxillary sinus tenderness, nares clear Neck- Supple,+ LAD CVS- RRR, no murmur RESP-CTAB EXT- No edema Pulses- Radial 2+          Assessment & Plan:      Problem List Items Addressed This Visit    None    Visit Diagnoses    Tonsillopharyngitis    -  Primary   Strep neg, but symptoms less than 24 hour so may be false neg, based on exam centor criteria start amox, salt water, ibuprofen   Relevant Orders   STREP GROUP A AG, W/REFLEX TO CULT      Note: This dictation was prepared with Dragon dictation along with smaller phrase technology. Any transcriptional errors that result from this process are unintentional.

## 2015-10-25 LAB — CULTURE, GROUP A STREP: ORGANISM ID, BACTERIA: NORMAL

## 2015-10-30 ENCOUNTER — Telehealth: Payer: Self-pay | Admitting: Family Medicine

## 2015-10-30 MED ORDER — AMOXICILLIN-POT CLAVULANATE 875-125 MG PO TABS: 1.0000 | ORAL_TABLET | Freq: Two times a day (BID) | ORAL | 0 refills | Status: DC

## 2015-10-30 NOTE — Telephone Encounter (Signed)
Finished antibiotics yesterday.  Still feeling bad.  Still with nasty discolored nasal discharge.  Please advise

## 2015-10-30 NOTE — Telephone Encounter (Signed)
Change to Augmentin 875 1 po BID for 7 days

## 2015-10-30 NOTE — Telephone Encounter (Signed)
Pt called and made aware of new RX  Will NTBS if not better after this round of abx

## 2016-01-05 ENCOUNTER — Encounter: Payer: BLUE CROSS/BLUE SHIELD | Admitting: Family Medicine

## 2016-01-17 ENCOUNTER — Telehealth: Payer: Self-pay | Admitting: Family Medicine

## 2016-01-17 MED ORDER — CLONAZEPAM 1 MG PO TABS: 1.0000 mg | ORAL_TABLET | Freq: Two times a day (BID) | ORAL | 2 refills | Status: DC | PRN

## 2016-01-17 NOTE — Telephone Encounter (Signed)
Requesting refill on Klonopin 1 mg - [facility] 07/04/15 #60/2 LOV - 10/23/15 - Ok to refill??

## 2016-01-17 NOTE — Telephone Encounter (Signed)
Approved # [id number] 

## 2016-01-17 NOTE — Telephone Encounter (Signed)
Medication called/sent to requested pharmacy  

## 2016-02-14 ENCOUNTER — Ambulatory Visit (INDEPENDENT_AMBULATORY_CARE_PROVIDER_SITE_OTHER): Payer: BLUE CROSS/BLUE SHIELD | Admitting: Family Medicine

## 2016-02-14 ENCOUNTER — Encounter: Payer: Self-pay | Admitting: Family Medicine

## 2016-02-14 VITALS — BP 112/64 | HR 72 | Temp 98.7°F | Resp 14 | Ht 69.0 in | Wt 191.0 lb

## 2016-02-14 DIAGNOSIS — F411 Generalized anxiety disorder: Secondary | ICD-10-CM

## 2016-02-14 DIAGNOSIS — Z0000 Encounter for general adult medical examination without abnormal findings: Secondary | ICD-10-CM | POA: Diagnosis not present

## 2016-02-14 DIAGNOSIS — S4992XD Unspecified injury of left shoulder and upper arm, subsequent encounter: Secondary | ICD-10-CM | POA: Diagnosis not present

## 2016-02-14 DIAGNOSIS — F5104 Psychophysiologic insomnia: Secondary | ICD-10-CM

## 2016-02-14 MED ORDER — KETOCONAZOLE 2 % EX SHAM: MEDICATED_SHAMPOO | CUTANEOUS | 6 refills | Status: DC

## 2016-02-14 NOTE — Progress Notes (Signed)
   Subjective:    Patient ID: Maurice Sims, male    DOB: Feb 26, 1970, 46 y.o.   MRN: [id number]  Patient presents for CPE (is not fasting)   Pt here for CPE, medications history reviewed  Maintained on klonopin for anxiety and chronic insomnia  Tinea veriscolor uses nizoral shampoo to keep symptoms at bay Flu shot declines TDAP- UTD   Due for fasting labs in past 2016  had minimally elevated LDL   Christmas Eve- was running and slid and fell and hit an enbankment, shoulder was discolated, He went to a walk-in orthopedic clinic in [place] 2 days ago. I was able to see some notations on care everywhere his diagnosis was shoulder strain daily x-rays clavicle area however. He took anti-inflammatory and some pain medication he still gets some discomfort with raising his arm he thinks that he may have actually broken something that was not x-rayed. At this point he is getting his strength back therefore he was a hold on any further evaluation    Review Of Systems:  GEN- denies fatigue, fever, weight loss,weakness, recent illness HEENT- denies eye drainage, change in vision, nasal discharge, CVS- denies chest pain, palpitations RESP- denies SOB, cough, wheeze ABD- denies N/V, change in stools, abd pain GU- denies dysuria, hematuria, dribbling, incontinence MSK- + joint pain, muscle aches, injury Neuro- denies headache, dizziness, syncope, seizure activity       Objective:    BP 112/64 (BP Location: Left Arm, Patient Position: Sitting, Cuff Size: Large)   Pulse 72   Temp 98.7 F (37.1 C) (Oral)   Resp 14   Ht 5\' 9"  (1.753 m)   Wt 191 lb (86.6 kg)   SpO2 98%   BMI 28.21 kg/m  GEN- NAD, alert and oriented x3 HEENT- PERRL, EOMI, non injected sclera, pink conjunctiva, MMM, oropharynx clear, TM clear bilat no effusion  Neck- Supple, no thyromegaly CVS- RRR, no murmur RESP-CTAB ABD-NABS,soft,NT,ND MSK- Left arm- fair ROM, unable to get directly up to ear, rotator cuff in  tact, equivical empty can left side, neg hawkins, biceps in tact  Psych- normal affect and mood  EXT- No edema Pulses- Radial, DP- 2+        Assessment & Plan:      Problem List Items Addressed This Visit    GAD (generalized anxiety disorder)    Doing well with klonopin      Chronic insomnia    Continue klonopin       Other Visit Diagnoses    Routine general medical examination at a health care facility    -  Primary   CPE done, declines flu, fasting labs   Relevant Orders   CBC with Differential/Platelet   Comprehensive metabolic panel   Lipid panel   Shoulder injury, left, subsequent encounter       some decreased ROM, but improving per pt, hold on imaging, strength good in UE      Note: This dictation was prepared with Dragon dictation along with smaller phrase technology. Any transcriptional errors that result from this process are unintentional.

## 2016-02-14 NOTE — Assessment & Plan Note (Signed)
Doing well with klonopin

## 2016-02-14 NOTE — Assessment & Plan Note (Signed)
Continue klonopin 

## 2016-02-14 NOTE — Patient Instructions (Signed)
We will call with lab results  F/U 1 year or as needed

## 2016-02-15 ENCOUNTER — Other Ambulatory Visit: Payer: Self-pay | Admitting: *Deleted

## 2016-02-15 DIAGNOSIS — Z13220 Encounter for screening for lipoid disorders: Secondary | ICD-10-CM

## 2016-02-15 LAB — CBC WITH DIFFERENTIAL/PLATELET
BASOS PCT: 0 %
Basophils Absolute: 0 cells/uL (ref 0–200)
EOS PCT: 1 %
Eosinophils Absolute: 68 cells/uL (ref 15–500)
HCT: 43.6 % (ref 38.5–50.0)
Hemoglobin: 14.3 g/dL (ref 13.0–17.0)
Lymphocytes Relative: 38 %
Lymphs Abs: 2584 cells/uL (ref 850–3900)
MCH: 29.1 pg (ref 27.0–33.0)
MCHC: 32.8 g/dL (ref 32.0–36.0)
MCV: 88.8 fL (ref 80.0–100.0)
MONOS PCT: 8 %
MPV: 10.8 fL (ref 7.5–12.5)
Monocytes Absolute: 544 cells/uL (ref 200–950)
NEUTROS ABS: 3604 {cells}/uL (ref 1500–7800)
Neutrophils Relative %: 53 %
PLATELETS: 223 10*3/uL (ref 140–400)
RBC: 4.91 MIL/uL (ref 4.20–5.80)
RDW: 13.7 % (ref 11.0–15.0)
WBC: 6.8 10*3/uL (ref 3.8–10.8)

## 2016-02-15 LAB — COMPREHENSIVE METABOLIC PANEL
ALK PHOS: 50 U/L (ref 40–115)
ALT: 39 U/L (ref 9–46)
AST: 31 U/L (ref 10–40)
Albumin: 4.2 g/dL (ref 3.6–5.1)
BUN: 23 mg/dL (ref 7–25)
CO2: 28 mmol/L (ref 20–31)
CREATININE: 1.29 mg/dL (ref 0.60–1.35)
Calcium: 9.3 mg/dL (ref 8.6–10.3)
Chloride: 106 mmol/L (ref 98–110)
Glucose, Bld: 80 mg/dL (ref 70–99)
Potassium: 3.9 mmol/L (ref 3.5–5.3)
SODIUM: 142 mmol/L (ref 135–146)
TOTAL PROTEIN: 6.7 g/dL (ref 6.1–8.1)
Total Bilirubin: 0.3 mg/dL (ref 0.2–1.2)

## 2016-02-15 LAB — LIPID PANEL
CHOLESTEROL: 208 mg/dL — AB (ref <200)
HDL: 36 mg/dL — ABNORMAL LOW (ref >40)
LDL Cholesterol: 143 mg/dL — ABNORMAL HIGH (ref <100)
Total CHOL/HDL Ratio: 5.8 Ratio — ABNORMAL HIGH (ref <5.0)
Triglycerides: 144 mg/dL (ref <150)
VLDL: 29 mg/dL (ref <30)

## 2016-04-06 ENCOUNTER — Emergency Department: Payer: No Typology Code available for payment source

## 2016-04-06 ENCOUNTER — Emergency Department
Admission: EM | Admit: 2016-04-06 | Discharge: 2016-04-06 | Disposition: A | Discharge status: Home / Self Care | Payer: No Typology Code available for payment source | Attending: Emergency Medicine | Admitting: Emergency Medicine

## 2016-04-06 ENCOUNTER — Encounter: Payer: Self-pay | Admitting: Emergency Medicine

## 2016-04-06 DIAGNOSIS — Z87891 Personal history of nicotine dependence: Secondary | ICD-10-CM | POA: Insufficient documentation

## 2016-04-06 DIAGNOSIS — R519 Headache, unspecified: Secondary | ICD-10-CM

## 2016-04-06 DIAGNOSIS — R51 Headache: Secondary | ICD-10-CM | POA: Insufficient documentation

## 2016-04-06 MED ORDER — KETOROLAC TROMETHAMINE 60 MG/2ML IM SOLN
30.0000 mg | Freq: Once | INTRAMUSCULAR | Status: AC
  Administered 2016-04-06: 30 mg via INTRAMUSCULAR
  Filled 2016-04-06: qty 2

## 2016-04-06 MED ORDER — BUTALBITAL-APAP-CAFFEINE 50-325-40 MG PO TABS
1.0000 | ORAL_TABLET | Freq: Once | ORAL | Status: AC
  Administered 2016-04-06: 1 via ORAL
  Filled 2016-04-06: qty 1

## 2016-04-06 MED ORDER — SUMATRIPTAN SUCCINATE 6 MG/0.5ML ~~LOC~~ SOLN
6.0000 mg | Freq: Once | SUBCUTANEOUS | Status: AC
  Administered 2016-04-06: 6 mg via SUBCUTANEOUS
  Filled 2016-04-06: qty 0.5

## 2016-04-06 NOTE — ED Provider Notes (Signed)
[facility] Emergency Department Provider Note  ____________________________________________  Time seen: Approximately 4:45 PM  I have reviewed the triage vital signs and the nursing notes.   HISTORY  Chief Complaint Headache    HPI Maurice Sims is a 46 y.o. male that presents to the emergency department with headache for 4 days. Pain is at the top of his head. Patient states that he gets headaches like this when he has sinus congestion. However, he does not have any sinus congestion at this time. He states that this is the worst headache that he has ever had. He has a history of head injuries. Patient started a new job this week and has been sitting at a desk. She has felt increased stress this week. Patient states that he has taken Sudafed, ibuprofen, Tylenol, Percocet, and "nausea medicine" for headache without relief. No aura. No history of migraines. Patient is not taking any blood thinners. Patient denies fever, congestion, visual changes, shortness of breath, chest pain, nausea, vomiting, abdominal pain, weakness, numbness, tingling.   Past Medical History:  Diagnosis Date  . Anxiety   . Diverticulitis     Patient Active Problem List   Diagnosis Date Noted  . GAD (generalized anxiety disorder) 01/02/2015  . Chronic insomnia 04/26/2014  . Pain in joint, shoulder region 12/28/2013  . Diverticulitis 05/18/2013  . Abdominal pain, unspecified site 05/18/2013  . Tinea versicolor 11/19/2012    Past Surgical History:  Procedure Laterality Date  . ELBOW SURGERY    . HERNIA REPAIR      Prior to Admission medications   Medication Sig Start Date End Date Taking? Authorizing Provider  b complex vitamins tablet Take 1 tablet by mouth daily.    Historical Provider, MD  clonazePAM (KLONOPIN) 1 MG tablet Take 1 tablet (1 mg total) by mouth 2 (two) times daily as needed. 01/17/16   Patriciaann Clan Dixon, PA-C  ketoconazole (NIZORAL) 2 % shampoo APPLY EXTERNALLY  2 TIMES A WEEK 02/14/16   Salley Scarlet, MD  Multiple Vitamin (MULITIVITAMIN WITH MINERALS) TABS Take 1 tablet by mouth daily.    Historical Provider, MD    Allergies Patient has no active allergies.  No family history on file.  Social History Social History  Substance Use Topics  . Smoking status: Former Smoker    Quit date: 01/15/2007  . Smokeless tobacco: Never Used  . Alcohol use Yes     Comment: weekends     Review of Systems  Constitutional: No fever/chills ENT: No upper respiratory complaints. Cardiovascular: No chest pain. Respiratory: No cough. No SOB. Gastrointestinal: No abdominal pain.  No nausea, no vomiting.  Musculoskeletal: Negative for musculoskeletal pain. Skin: Negative for rash, abrasions, lacerations, ecchymosis. Neurological: Negative for numbness or tingling. Positive for headache.   ____________________________________________   PHYSICAL EXAM:  VITAL SIGNS: ED Triage Vitals  Enc Vitals Group     BP 04/06/16 1518 (!) 145/89     Pulse Rate 04/06/16 1518 77     Resp 04/06/16 1518 18     Temp 04/06/16 1518 98.3 F (36.8 C)     Temp Source 04/06/16 1518 Oral     SpO2 04/06/16 1518 96 %     Weight 04/06/16 1519 182 lb (82.6 kg)     Height 04/06/16 1519 5\' 10"  (1.778 m)     Head Circumference --      Peak Flow --      Pain Score 04/06/16 1519 7     Pain Loc --  Pain Edu? --      Excl. in GC? --      Constitutional: Alert and oriented. Well appearing and in no acute distress. Eyes: Conjunctivae are normal. PERRL. EOMI. Head: Atraumatic. ENT:      Ears:      Nose: No congestion/rhinnorhea.      Mouth/Throat: Mucous membranes are moist.  Neck: No stridor.   Cardiovascular: Normal rate, regular rhythm.  Good peripheral circulation. Respiratory: Normal respiratory effort without tachypnea or retractions. Lungs CTAB. Good air entry to the bases with no decreased or absent breath sounds. Musculoskeletal: Full range of motion to all  extremities. No gross deformities appreciated. Neurologic: Normal speech and language. No gross focal neurologic deficits are appreciated.  Cranial nerves: 2-10 normal as tested. Strength 5/5 in upper and lower extremities Cerebellar: Finger-nose-finger WNL, Heel to shin WNL Sensorimotor: No pronator drift, clonus, sensory loss or abnormal reflexes. No vision deficits noted to confrontation bilaterally.  Speech: No dysarthria or expressive aphasia Skin:  Skin is warm, dry and intact. No rash noted.    ____________________________________________   LABS (all labs ordered are listed, but only abnormal results are displayed)  Labs Reviewed - No data to display ____________________________________________  EKG   ____________________________________________  RADIOLOGY  Ct Head Wo Contrast  Result Date: 04/06/2016 CLINICAL DATA:  Left vertex headache x 5 days, no relief with OTC meds EXAM: CT HEAD WITHOUT CONTRAST TECHNIQUE: Contiguous axial images were obtained from the base of the skull through the vertex without intravenous contrast. COMPARISON:  09/06/2013 FINDINGS: Brain: No evidence of acute infarction, hemorrhage, hydrocephalus, extra-axial collection or mass lesion/mass effect. Vascular: No hyperdense vessel or unexpected calcification. Skull: Normal. Negative for fracture or focal lesion. Sinuses/Orbits: No acute finding. Other: None IMPRESSION: Negative exam. Electronically Signed   By: Norva PavlovElizabeth  Brown M.D.   On: 04/06/2016 20:19    ____________________________________________    PROCEDURES  Procedure(s) performed:    Procedures    Medications  ketorolac (TORADOL) injection 30 mg (30 mg Intramuscular Given 04/06/16 1648)  SUMAtriptan (IMITREX) injection 6 mg (6 mg Subcutaneous Given 04/06/16 1653)  butalbital-acetaminophen-caffeine (FIORICET, ESGIC) 50-325-40 MG per tablet 1 tablet (1 tablet Oral Given 04/06/16 1755)      ____________________________________________   INITIAL IMPRESSION / ASSESSMENT AND PLAN / ED COURSE  Pertinent labs & imaging results that were available during my care of the patient were reviewed by me and considered in my medical decision making (see chart for details).  Review of the [place] CSRS was performed in accordance of the [facility] prior to dispensing any controlled drugs.     Patient's diagnosis is consistent with headache. Vital signs and exam are reassuring. Neuro exam within normal limits. Patient was given Toradol, Imitrex, Fioricet in ED. Patient states that this did not touch the headache so a head CT was completed. No acute processes seen on head CT. After CT was completed, patient states that headache felt better. Patient states that "I think I got myself worked up and made it worse because I read on the Internet that it might be an aneurysm."  Patient is to follow up with PCP as directed. Patient is given ED precautions to return to the ED for any worsening or new symptoms.     ____________________________________________  FINAL CLINICAL IMPRESSION(S) / ED DIAGNOSES  Final diagnoses:  Bad headache      NEW MEDICATIONS STARTED DURING THIS VISIT:  Discharge Medication List as of 04/06/2016  8:45 PM  This chart was dictated using voice recognition software/Dragon. Despite best efforts to proofread, errors can occur which can change the meaning. Any change was purely unintentional.    Enid Derry, PA-C 04/06/16 2349    Merrily Brittle, MD 04/07/16 [phone]

## 2016-04-06 NOTE — ED Triage Notes (Signed)
L sided headache, puts hand on top of head to show region, x 4 day. Denies injury. Denies sinus drainage.

## 2016-04-15 ENCOUNTER — Encounter: Payer: Self-pay | Admitting: Family Medicine

## 2016-04-15 ENCOUNTER — Ambulatory Visit (INDEPENDENT_AMBULATORY_CARE_PROVIDER_SITE_OTHER): Payer: No Typology Code available for payment source | Admitting: Family Medicine

## 2016-04-15 VITALS — BP 118/60 | HR 72 | Temp 98.0°F | Resp 14 | Ht 69.0 in | Wt 190.0 lb

## 2016-04-15 DIAGNOSIS — F411 Generalized anxiety disorder: Secondary | ICD-10-CM | POA: Diagnosis not present

## 2016-04-15 DIAGNOSIS — R51 Headache: Secondary | ICD-10-CM

## 2016-04-15 DIAGNOSIS — R519 Headache, unspecified: Secondary | ICD-10-CM

## 2016-04-15 NOTE — Progress Notes (Signed)
Subjective:    Patient ID: Maurice Sims, male    DOB: 09-21-1970, 46 y.o.   MRN: [id number]  Patient presents for Headache (x10 days- HA in top of head that feel like he has been hit on the top of his head- has been seen at [facility] and given injections and Fioricet) and Difficulty in Focus (states that he hsa changed jobs and now is noting difficulty focusing on tasks) Patient or with headache was seen at the ER on March 24 with a headache for 4 days in a row. States it was a worse headache he has had. That worsen his typical sinus headaches. He is taking Sudafed and ibuprofen nausea medicine without any relief. His CT of head which viewed  sinuses as well- which revealed no acute finding. He was given a migraine cocktail in the ER including Toradol and Imitrex and Firociet His headache persisted for another 5 days after the ER visit and finally went away. He does remember that the day prior he is started new job and has been having some increased stress and anxiety difficulty focusing. He even tried taking his clonazepam at this has not helped. Things have improved over the past couple weeks but he is concerned about his focusing. He is not sure that caused some of his headache. He also had injury to his left shoulder back in December and he remembers popping his shoulder then had shooting pain up the back of his head when his headache started as well. He is now headache free   Review Of Systems:  GEN- denies fatigue, fever, weight loss,weakness, recent illness HEENT- denies eye drainage, change in vision, nasal discharge, CVS- denies chest pain, palpitations RESP- denies SOB, cough, wheeze ABD- denies N/V, change in stools, abd pain GU- denies dysuria, hematuria, dribbling, incontinence MSK- denies joint pain, muscle aches, injury Neuro-+headache, dizziness, syncope, seizure activity       Objective:    BP 118/60   Pulse 72   Temp 98 F (36.7 C) (Oral)   Resp 14   Ht   (1.753 m)   Wt 190 lb (86.2 kg)   SpO2 98%   BMI 28.06 kg/m  GEN- NAD, alert and oriented x3 HEENT- PERRL, EOMI, non injected sclera, pink conjunctiva, MMM, oropharynx clear Neck- Supple, no thyromegaly CVS- RRR, no murmur RESP-CTAB Neuro-CNII-XII in tact, no deficits  Psych- normal affect and mood  EXT- No edema Pulses- Radial 2+        Assessment & Plan:      Problem List Items Addressed This Visit    GAD (generalized anxiety disorder) - Primary    He has underlying anxiety the new job in itself could be causing some of the anxiety and stresses that he is not used to. I would give it another few weeks before starting him on anything help with focus as this may improve on its own once he gets into the swing of things. We did discuss Adderall for concentration and focus spell like to hold off at this time.   Miquel Dunn the headaches. I differential to include tension headache which may resulted from when he popped his shoulder as well as the stress with his new job. Other possibility is development of cluster headaches though this is healing time he has had a severe headache that lasted this many days. This time were discussed to monitor and see if he has any recurrent headaches. There was no sign of any sinusitis. If he does have  recurrent headaches especially in the setting area would obtain MRI of the brain and consider starting something like Inderal.       Other Visit Diagnoses    Nonintractable headache, unspecified chronicity pattern, unspecified headache type       Relevant Medications   butalbital-acetaminophen-caffeine (FIORICET, ESGIC) 50-325-40 MG tablet      Note: This dictation was prepared with Dragon dictation along with smaller phrase technology. Any transcriptional errors that result from this process are unintentional.

## 2016-04-15 NOTE — Assessment & Plan Note (Signed)
He has underlying anxiety the new job in itself could be causing some of the anxiety and stresses that he is not used to. I would give it another few weeks before starting him on anything help with focus as this may improve on its own once he gets into the swing of things. We did discuss Adderall for concentration and focus spell like to hold off at this time.   Miquel Dunn the headaches. I differential to include tension headache which may resulted from when he popped his shoulder as well as the stress with his new job. Other possibility is development of cluster headaches though this is healing time he has had a severe headache that lasted this many days. This time were discussed to monitor and see if he has any recurrent headaches. There was no sign of any sinusitis. If he does have recurrent headaches especially in the setting area would obtain MRI of the brain and consider starting something like Inderal.

## 2016-04-15 NOTE — Patient Instructions (Addendum)
f/u AS NEEDED  Call if symptoms return

## 2016-05-09 ENCOUNTER — Encounter: Payer: Self-pay | Admitting: Family Medicine

## 2016-05-10 MED ORDER — AMPHETAMINE-DEXTROAMPHET ER 20 MG PO CP24: 20.0000 mg | ORAL_CAPSULE | ORAL | 0 refills | Status: DC

## 2016-06-12 ENCOUNTER — Encounter: Payer: Self-pay | Admitting: Family Medicine

## 2016-06-12 ENCOUNTER — Ambulatory Visit (INDEPENDENT_AMBULATORY_CARE_PROVIDER_SITE_OTHER): Payer: No Typology Code available for payment source | Admitting: Family Medicine

## 2016-06-12 DIAGNOSIS — F900 Attention-deficit hyperactivity disorder, predominantly inattentive type: Secondary | ICD-10-CM

## 2016-06-12 DIAGNOSIS — F988 Other specified behavioral and emotional disorders with onset usually occurring in childhood and adolescence: Secondary | ICD-10-CM | POA: Insufficient documentation

## 2016-06-12 MED ORDER — AMPHETAMINE-DEXTROAMPHETAMINE 20 MG PO TABS: 20.0000 mg | ORAL_TABLET | Freq: Two times a day (BID) | ORAL | 0 refills | Status: DC

## 2016-06-12 MED ORDER — CLONAZEPAM 1 MG PO TABS: 1.0000 mg | ORAL_TABLET | Freq: Two times a day (BID) | ORAL | 2 refills | Status: DC | PRN

## 2016-06-12 NOTE — Patient Instructions (Addendum)
F/U Jan for Physical  

## 2016-06-12 NOTE — Assessment & Plan Note (Addendum)
Continue Adderall he is benefiting and more productive changed to 20mg  BID due to cost No sign of overuse or abuse of  Medication gIVE SCRIPT for June/July

## 2016-06-12 NOTE — Progress Notes (Signed)
   Subjective:    Patient ID: Maurice Sims, male    DOB: 03-12-1970, 46 y.o.   MRN: [id number]  Patient presents for Follow-up (is not fasting)  Pt here for intermin f/u, last visit seen for anxiety/stress at work, difficulty concentrating with new job. He had had increased headaches as well. THose did settle but he continued to have difficulty with his job and focus, decided to try Adderall 20mg  once a day which he started approx 4 weeks ago.  He states that his concentration is much improved with the Adderall he is not had any difficulties at work he has not had any side effects from the medication. He has not had any recent migraines he has had a change in his insurance and needs to be changed to the immediate release to cost..   Review Of Systems:  GEN- denies fatigue, fever, weight loss,weakness, recent illness HEENT- denies eye drainage, change in vision, nasal discharge, CVS- denies chest pain, palpitations RESP- denies SOB, cough, wheeze ABD- denies N/V, change in stools, abd pain GU- denies dysuria, hematuria, dribbling, incontinence MSK- denies joint pain, muscle aches, injury Neuro- denies headache, dizziness, syncope, seizure activity       Objective:    BP 120/68   Pulse 80   Temp 97.9 F (36.6 C) (Oral)   Resp 14   Ht 5\' 9"  (1.753 m)   Wt 181 lb (82.1 kg)   SpO2 98%   BMI 26.73 kg/m  GEN- NAD, alert and oriented x3 CVS- RRR, no murmur RESP-CTAB Psych- normal affect and mood EXT- No edema Pulses- Radial 2+        Assessment & Plan:      Problem List Items Addressed This Visit    ADD (attention deficit disorder)    Continue Adderall he is benefiting and more productive changed to 20mg  BID due to cost No sign of overuse or abuse of  Medication gIVE SCRIPT for June/July         Note: This dictation was prepared with Dragon dictation along with smaller phrase technology. Any transcriptional errors that result from this process are  unintentional.

## 2016-08-13 ENCOUNTER — Telehealth: Payer: Self-pay | Admitting: Family Medicine

## 2016-08-13 MED ORDER — AMPHETAMINE-DEXTROAMPHETAMINE 20 MG PO TABS: 20.0000 mg | ORAL_TABLET | Freq: Two times a day (BID) | ORAL | 0 refills | Status: DC

## 2016-08-13 MED ORDER — AMPHETAMINE-DEXTROAMPHETAMINE 20 MG PO TABS
20.0000 mg | ORAL_TABLET | Freq: Two times a day (BID) | ORAL | 0 refills | Status: DC
Start: 2016-08-13 — End: 2016-11-22

## 2016-08-13 NOTE — Telephone Encounter (Signed)
Okay to refill? 

## 2016-08-13 NOTE — Telephone Encounter (Signed)
Ok to refill??  Last office visit/  refill 06/12/2016, #3 printed prescriptions.

## 2016-08-13 NOTE — Telephone Encounter (Signed)
Pt needs refill on adderall °

## 2016-08-13 NOTE — Telephone Encounter (Signed)
Prescription printed x3 and patient made aware to come to office to pick up on 08/14/2016.

## 2016-08-28 ENCOUNTER — Encounter: Payer: Self-pay | Admitting: Family Medicine

## 2016-08-28 ENCOUNTER — Ambulatory Visit (INDEPENDENT_AMBULATORY_CARE_PROVIDER_SITE_OTHER): Payer: BLUE CROSS/BLUE SHIELD | Admitting: Family Medicine

## 2016-08-28 VITALS — BP 122/72 | HR 78 | Temp 98.7°F | Resp 14 | Ht 69.0 in | Wt 173.0 lb

## 2016-08-28 DIAGNOSIS — J069 Acute upper respiratory infection, unspecified: Secondary | ICD-10-CM

## 2016-08-28 MED ORDER — HYDROCOD POLST-CPM POLST ER 10-8 MG/5ML PO SUER: 5.0000 mL | Freq: Two times a day (BID) | ORAL | 0 refills | Status: DC | PRN

## 2016-08-28 NOTE — Patient Instructions (Signed)
F/U as needed or not improved

## 2016-08-28 NOTE — Progress Notes (Signed)
   Subjective:    Patient ID: Maurice GraterChristopher D Weckerly, male    DOB: 23-Jul-1970, 46 y.o.   MRN: [id number]  Patient presents for Cough (x2 days- burning in chest with non-productive cough)   Pt here with cough, mild production for past 2 days, had burning sensation with cough fit this AM so came in. No CP, no SOB if not coughing, cough keeping him up. Sick contacts, grandchild had first then GF when they were at the beach. Initially sore throat, sinus drainage, that has improved Subjective fever last night. Taking mucinex DM, nasal saline   Review Of Systems:  GEN- denies fatigue+ fever, weight loss,weakness, recent illness HEENT- denies eye drainage, change in vision,+ nasal discharge, CVS- denies chest pain, palpitations RESP- denies SOB, +cough, wheeze ABD- denies N/V, change in stools, abd pain GU- denies dysuria, hematuria, dribbling, incontinence MSK- denies joint pain, muscle aches, injury Neuro- denies headache, dizziness, syncope, seizure activity       Objective:    BP 122/72   Pulse 78   Temp 98.7 F (37.1 C) (Oral)   Resp 14   Ht 5\' 9"  (1.753 m)   Wt 173 lb (78.5 kg)   SpO2 98%   BMI 25.55 kg/m  GEN- NAD, alert and oriented x3 HEENT- PERRL, EOMI, non injected sclera, pink conjunctiva, MMM, oropharynx mild injection, TM clear bilat no effusion, mild  maxillary sinus tenderness, inflammed turbinates,  Nasal drainage  Neck- Supple, no LAD CVS- RRR, no murmur RESP-CTAB Pulses- Radial 2+        Assessment & Plan:      Problem List Items Addressed This Visit    None    Visit Diagnoses    Viral URI    -  Primary   Treat as viral URI, sympatomatic care, given Tussionex for cough, use anti-histamine, nasal saline      Note: This dictation was prepared with Dragon dictation along with smaller phrase technology. Any transcriptional errors that result from this process are unintentional.

## 2016-11-05 ENCOUNTER — Other Ambulatory Visit: Payer: Self-pay | Admitting: Family Medicine

## 2016-11-05 NOTE — Telephone Encounter (Signed)
Okay to refill? 

## 2016-11-05 NOTE — Telephone Encounter (Signed)
Ok to refill??  Last office visit 08/28/2016.  Last refill 06/12/2016, #2 refills.

## 2016-11-07 NOTE — Telephone Encounter (Signed)
Medication called to pharmacy. 

## 2016-11-21 ENCOUNTER — Telehealth: Payer: Self-pay | Admitting: *Deleted

## 2016-11-21 NOTE — Telephone Encounter (Signed)
Received call from patient.   Requested refill on Adderall.   Ok to refill??  Last office visit 08/28/2016.  Last refill 08/13/2016, x3 printed prescriptions.

## 2016-11-22 MED ORDER — AMPHETAMINE-DEXTROAMPHETAMINE 20 MG PO TABS: 20.0000 mg | ORAL_TABLET | Freq: Two times a day (BID) | ORAL | 0 refills | Status: DC

## 2016-11-22 NOTE — Telephone Encounter (Signed)
Prescriptions printed.  Call placed to patient. No answer. VM full.

## 2016-11-22 NOTE — Telephone Encounter (Signed)
Okay to refill adderall given 3

## 2016-11-22 NOTE — Telephone Encounter (Signed)
Patient returned call and made aware.

## 2017-03-11 ENCOUNTER — Other Ambulatory Visit: Payer: Self-pay | Admitting: *Deleted

## 2017-03-11 ENCOUNTER — Other Ambulatory Visit: Payer: Self-pay | Admitting: Family Medicine

## 2017-03-11 MED ORDER — AMPHETAMINE-DEXTROAMPHETAMINE 20 MG PO TABS: 20.0000 mg | ORAL_TABLET | Freq: Two times a day (BID) | ORAL | 0 refills | Status: DC

## 2017-03-11 NOTE — Telephone Encounter (Signed)
Needs OV to f/u meds  

## 2017-03-11 NOTE — Telephone Encounter (Signed)
[place] [facility] [place] [facility]   Reports that e-script was not transmitted correctly and requested resubmission.   j

## 2017-03-11 NOTE — Telephone Encounter (Signed)
Ok to refill??  Last office visit 08/28/2016.  Last refill 12/02/2016.

## 2017-03-11 NOTE — Telephone Encounter (Signed)
Appointment scheduled.

## 2017-03-17 ENCOUNTER — Ambulatory Visit: Payer: BLUE CROSS/BLUE SHIELD | Admitting: Family Medicine

## 2017-03-17 ENCOUNTER — Encounter: Payer: Self-pay | Admitting: Family Medicine

## 2017-03-17 ENCOUNTER — Other Ambulatory Visit: Payer: Self-pay

## 2017-03-17 VITALS — BP 120/78 | HR 74 | Temp 99.3°F | Resp 14 | Ht 69.0 in | Wt 183.0 lb

## 2017-03-17 DIAGNOSIS — B360 Pityriasis versicolor: Secondary | ICD-10-CM

## 2017-03-17 DIAGNOSIS — R05 Cough: Secondary | ICD-10-CM

## 2017-03-17 DIAGNOSIS — F900 Attention-deficit hyperactivity disorder, predominantly inattentive type: Secondary | ICD-10-CM | POA: Diagnosis not present

## 2017-03-17 DIAGNOSIS — F411 Generalized anxiety disorder: Secondary | ICD-10-CM | POA: Diagnosis not present

## 2017-03-17 DIAGNOSIS — Z9852 Vasectomy status: Secondary | ICD-10-CM | POA: Diagnosis not present

## 2017-03-17 DIAGNOSIS — R059 Cough, unspecified: Secondary | ICD-10-CM

## 2017-03-17 MED ORDER — AMPHETAMINE-DEXTROAMPHETAMINE 20 MG PO TABS: 20.0000 mg | ORAL_TABLET | Freq: Two times a day (BID) | ORAL | 0 refills | Status: DC

## 2017-03-17 MED ORDER — HYDROCOD POLST-CPM POLST ER 10-8 MG/5ML PO SUER: 5.0000 mL | Freq: Two times a day (BID) | ORAL | 0 refills | Status: DC | PRN

## 2017-03-17 NOTE — Progress Notes (Signed)
   Subjective:    Patient ID: Maurice Sims, male    DOB: Oct 01, 1970, 47 y.o.   MRN: [id number]  Patient presents for Follow-up (is not fasting) and Cough (nonproductive cough)   Cough with occansional phlegm now intermittant and sinus drainage on and off for past couple of months. Taking OTC anti-hsitamine, was using tussionex until he ran out , was using OTC cough meds they dont help Cough worse at night. Takes vitamins Vitamin C Elderly , zinc ,  No recent fever  In general cough much improved compared to beginning   ADD- doing well with adderall feels he can focus well     GAD- uses klonopin as needed    Uses Nizoral to keep tinea veriscolor   Would like vasectomy reversed getting married in June   He is also downsizing the gauges in his ears to get wholes to close up        Review Of Systems:  GEN- denies fatigue, fever, weight loss,weakness, recent illness HEENT- denies eye drainage, change in vision, nasal discharge, CVS- denies chest pain, palpitations RESP- denies SOB,+ cough, wheeze ABD- denies N/V, change in stools, abd pain GU- denies dysuria, hematuria, dribbling, incontinence MSK- denies joint pain, muscle aches, injury Neuro- denies headache, dizziness, syncope, seizure activity       Objective:    BP 120/78   Pulse 74   Temp 99.3 F (37.4 C) (Temporal)   Resp 14   Ht 5\' 9"  (1.753 m)   Wt 183 lb (83 kg)   SpO2 96%   BMI 27.02 kg/m  GEN- NAD, alert and oriented x3 HEENT- PERRL, EOMI, non injected sclera, pink conjunctiva, MMM, oropharynx clear, nares clear  Neck- Supple, no thyromegaly, no LAD CVS- RRR, no murmur RESP-CTAB EXT- No edema Pulses- Radial  2+        Assessment & Plan:      Problem List Items Addressed This Visit      Unprioritized   Tinea versicolor    nizoral for treatment during flares       GAD (generalized anxiety disorder)    Doing well prn klonopin      ADD (attention deficit disorder)    Continue  adderall        Other Visit Diagnoses    Cough    -  Primary   Post bronchitic cough, mostly at night, given tussionex to try.    H/O vasectomy       Needs consult for reversal of vasectomy    Relevant Orders   Ambulatory referral to Urology      Note: This dictation was prepared with Dragon dictation along with smaller phrase technology. Any transcriptional errors that result from this process are unintentional.

## 2017-03-17 NOTE — Assessment & Plan Note (Signed)
nizoral for treatment during flares

## 2017-03-17 NOTE — Patient Instructions (Addendum)
Referral to urology  Tussionex for cough  Continue all other medications  F/U 4-6 MONTHS  PHYSICAL - Morning

## 2017-03-17 NOTE — Assessment & Plan Note (Signed)
Continue adderall  

## 2017-03-17 NOTE — Assessment & Plan Note (Signed)
Doing well prn klonopin

## 2017-05-19 ENCOUNTER — Other Ambulatory Visit: Payer: Self-pay | Admitting: Family Medicine

## 2017-05-19 NOTE — Telephone Encounter (Signed)
Ok to refill??  Last office visit 03/17/2017.  Last refill 11/07/2016, #2 refills.

## 2017-06-11 ENCOUNTER — Other Ambulatory Visit: Payer: Self-pay | Admitting: Family Medicine

## 2017-06-11 NOTE — Telephone Encounter (Signed)
Pt requesting refill on Adderall      LOV: 03/17/17   LRF:   03/17/17

## 2017-07-10 ENCOUNTER — Other Ambulatory Visit: Payer: Self-pay | Admitting: Family Medicine

## 2017-07-10 NOTE — Telephone Encounter (Signed)
Ok to refill??  Last office visit 03/17/2017.  Last refill 06/11/2017.

## 2017-07-22 ENCOUNTER — Other Ambulatory Visit: Payer: Self-pay

## 2017-07-22 ENCOUNTER — Ambulatory Visit (INDEPENDENT_AMBULATORY_CARE_PROVIDER_SITE_OTHER): Payer: BLUE CROSS/BLUE SHIELD | Admitting: Family Medicine

## 2017-07-22 ENCOUNTER — Encounter: Payer: Self-pay | Admitting: Family Medicine

## 2017-07-22 VITALS — BP 128/74 | HR 62 | Temp 97.9°F | Resp 14 | Ht 69.0 in | Wt 176.0 lb

## 2017-07-22 DIAGNOSIS — F411 Generalized anxiety disorder: Secondary | ICD-10-CM

## 2017-07-22 DIAGNOSIS — M7712 Lateral epicondylitis, left elbow: Secondary | ICD-10-CM

## 2017-07-22 DIAGNOSIS — Z0000 Encounter for general adult medical examination without abnormal findings: Secondary | ICD-10-CM

## 2017-07-22 DIAGNOSIS — F900 Attention-deficit hyperactivity disorder, predominantly inattentive type: Secondary | ICD-10-CM

## 2017-07-22 DIAGNOSIS — M7711 Lateral epicondylitis, right elbow: Secondary | ICD-10-CM

## 2017-07-22 MED ORDER — DICLOFENAC SODIUM 75 MG PO TBEC: 75.0000 mg | DELAYED_RELEASE_TABLET | Freq: Two times a day (BID) | ORAL | 0 refills | Status: DC

## 2017-07-22 NOTE — Progress Notes (Signed)
   Subjective:    Patient ID: Maurice Sims, male    DOB: Mar 08, 1970, 47 y.o.   MRN: [id number]  Patient presents for CPE (is fasting) and B Elbow Pain (states that he has been more active lately working on new home and now has throbing in B elbows- has been using straps for relief of possible tennis elbow)   Pt here for CPE  Medications reviewed Immunizations UTD  Discussed HIV screening   Due for fasting labs   ADD taking adderall as prescribed  Bilateral elbow pain, since June, has been working on remodeling his home painting pulling up carpet ETC, no swelling in the joints, has a small knot on right side, history of roken elbow on left  ook aleve and ibuprofen, told a couple a doses of oxycodone due to severe pain   using tendinitis bands Avoiding curls and heavy lifting at the [employer]  Tumeric / glucosamine chondroitin    GAD- takes temazepam as needed.  He does not have any concerns about his anxiety.  Of note he was married back in June   Review Of Systems:  GEN- denies fatigue, fever, weight loss,weakness, recent illness HEENT- denies eye drainage, change in vision, nasal discharge, CVS- denies chest pain, palpitations RESP- denies SOB, cough, wheeze ABD- denies N/V, change in stools, abd pain GU- denies dysuria, hematuria, dribbling, incontinence MSK- + joint pain, muscle aches, injury Neuro- denies headache, dizziness, syncope, seizure activity       Objective:    BP 128/74   Pulse 62   Temp 97.9 F (36.6 C) (Oral)   Resp 14   Ht 5\' 9"  (1.753 m)   Wt 176 lb (79.8 kg)   SpO2 99%   BMI 25.99 kg/m  GEN- NAD, alert and oriented x3 HEENT- PERRL, EOMI, non injected sclera, pink conjunctiva, MMM, oropharynx clear Neck- Supple, no thyromegaly CVS- RRR, no murmur RESP-CTAB ABD-NABS,soft,NT,ND MSK- bilat elbows, normal inspection,no effusion, good ROM, Right elbow TTP over latearl epicondyle, pain with rotation at elbow  EXT- No edema Pulses- Radial, DP-  2+        Assessment & Plan:      Problem List Items Addressed This Visit      Unprioritized   ADD (attention deficit disorder)    Continues to benefit from his Adderall no change to dose.      GAD (generalized anxiety disorder)    Continues to do well with his clonazepam no changes       Other Visit Diagnoses    Routine general medical examination at a health care facility    -  Primary   CPE done, fasting labs, schedule eye exam, f/u dentist    Relevant Orders   CBC with Differential/Platelet   Comprehensive metabolic panel   Lipid panel   HIV antibody   Lateral epicondylitis of both elbows       overuse syndrome, trial of diclofenac, ice, bands for 2 weeks straight, not improved discussed ortho referral for injections, he will just let me know   Relevant Medications   diclofenac (VOLTAREN) 75 MG EC tablet      Note: This dictation was prepared with Dragon dictation along with smaller phrase technology. Any transcriptional errors that result from this process are unintentional.

## 2017-07-22 NOTE — Assessment & Plan Note (Signed)
Continues to do well with his clonazepam no changes

## 2017-07-22 NOTE — Patient Instructions (Addendum)
F/U 6 months  Call if elbows due not improve , I will send orthopedic referral

## 2017-07-22 NOTE — Assessment & Plan Note (Signed)
Continues to benefit from his Adderall no change to dose.

## 2017-07-23 ENCOUNTER — Other Ambulatory Visit: Payer: Self-pay | Admitting: *Deleted

## 2017-07-23 DIAGNOSIS — E7800 Pure hypercholesterolemia, unspecified: Secondary | ICD-10-CM

## 2017-07-23 LAB — COMPREHENSIVE METABOLIC PANEL
AG RATIO: 2.2 (calc) (ref 1.0–2.5)
ALKALINE PHOSPHATASE (APISO): 67 U/L (ref 40–115)
ALT: 27 U/L (ref 9–46)
AST: 27 U/L (ref 10–40)
Albumin: 4.9 g/dL (ref 3.6–5.1)
BUN: 20 mg/dL (ref 7–25)
CALCIUM: 10.1 mg/dL (ref 8.6–10.3)
CO2: 27 mmol/L (ref 20–32)
Chloride: 103 mmol/L (ref 98–110)
Creat: 0.95 mg/dL (ref 0.60–1.35)
GLUCOSE: 91 mg/dL (ref 65–99)
Globulin: 2.2 g/dL (calc) (ref 1.9–3.7)
Potassium: 5.1 mmol/L (ref 3.5–5.3)
Sodium: 138 mmol/L (ref 135–146)
Total Bilirubin: 0.5 mg/dL (ref 0.2–1.2)
Total Protein: 7.1 g/dL (ref 6.1–8.1)

## 2017-07-23 LAB — HIV ANTIBODY (ROUTINE TESTING W REFLEX): HIV 1&2 Ab, 4th Generation: NONREACTIVE

## 2017-07-23 LAB — CBC WITH DIFFERENTIAL/PLATELET
BASOS ABS: 52 {cells}/uL (ref 0–200)
Basophils Relative: 1 %
EOS PCT: 1.9 %
Eosinophils Absolute: 99 cells/uL (ref 15–500)
HCT: 45.3 % (ref 38.5–50.0)
HEMOGLOBIN: 15.3 g/dL (ref 13.2–17.1)
Lymphs Abs: 1565 cells/uL (ref 850–3900)
MCH: 29.8 pg (ref 27.0–33.0)
MCHC: 33.8 g/dL (ref 32.0–36.0)
MCV: 88.3 fL (ref 80.0–100.0)
MPV: 10.9 fL (ref 7.5–12.5)
Monocytes Relative: 10.5 %
NEUTROS ABS: 2938 {cells}/uL (ref 1500–7800)
Neutrophils Relative %: 56.5 %
PLATELETS: 234 10*3/uL (ref 140–400)
RBC: 5.13 10*6/uL (ref 4.20–5.80)
RDW: 12.7 % (ref 11.0–15.0)
TOTAL LYMPHOCYTE: 30.1 %
WBC mixed population: 546 cells/uL (ref 200–950)
WBC: 5.2 10*3/uL (ref 3.8–10.8)

## 2017-07-23 LAB — LIPID PANEL
CHOLESTEROL: 245 mg/dL — AB (ref <200)
HDL: 50 mg/dL (ref >40)
LDL Cholesterol (Calc): 168 mg/dL (calc) — ABNORMAL HIGH
NON-HDL CHOLESTEROL (CALC): 195 mg/dL — AB (ref <130)
TRIGLYCERIDES: 134 mg/dL (ref <150)
Total CHOL/HDL Ratio: 4.9 (calc) (ref <5.0)

## 2017-08-11 ENCOUNTER — Other Ambulatory Visit: Payer: Self-pay | Admitting: Family Medicine

## 2017-08-11 NOTE — Telephone Encounter (Signed)
Ok to refill??  Last office visit 07/22/2017.  Last refill 07/11/2017.

## 2017-08-21 ENCOUNTER — Encounter: Payer: Self-pay | Admitting: Family Medicine

## 2017-08-21 ENCOUNTER — Ambulatory Visit: Payer: BLUE CROSS/BLUE SHIELD | Admitting: Family Medicine

## 2017-08-21 VITALS — BP 122/88 | HR 74 | Temp 98.2°F | Resp 16 | Ht 69.0 in | Wt 176.0 lb

## 2017-08-21 DIAGNOSIS — R5383 Other fatigue: Secondary | ICD-10-CM

## 2017-08-21 DIAGNOSIS — B349 Viral infection, unspecified: Secondary | ICD-10-CM

## 2017-08-21 DIAGNOSIS — J029 Acute pharyngitis, unspecified: Secondary | ICD-10-CM

## 2017-08-21 DIAGNOSIS — R5381 Other malaise: Secondary | ICD-10-CM | POA: Diagnosis not present

## 2017-08-21 NOTE — Progress Notes (Signed)
Subjective:    Patient ID: Maurice Sims, male    DOB: 22-Apr-1970, 47 y.o.   MRN: [id number]  HPI Patient is a very pleasant 47 year old Caucasian male who normally sees my partner.  He states that he was in his normal state of health until this weekend, Sunday he started feeling generalized malaise and fatigue.  He denies any fevers or chills however he does report a headache behind his sinuses and also a sore throat.  He denies any cough.  He denies any otalgia.  He denies any severe sinus pain.  He denies any vomiting or diarrhea although he does have some mild nausea.  He denies any chest pain shortness of breath dyspnea on exertion pleurisy or hemoptysis.  He denies any dysuria.  He recently had a physical exam with normal lab work aside his cholesterol on July 9.  He has no other significant medical problems.  He denies any recent travel.  There are no sick contacts at home.  Physical exam is significant only for erythema around his tonsils.  He has no tender lymphadenopathy. Past Medical History:  Diagnosis Date  . Anxiety   . Diverticulitis    Past Surgical History:  Procedure Laterality Date  . ELBOW SURGERY    . HERNIA REPAIR     Current Outpatient Medications on File Prior to Visit  Medication Sig Dispense Refill  . amphetamine-dextroamphetamine (ADDERALL) 20 MG tablet TAKE 1 TABLET BY MOUTH 2 TIMES DAILY 60 tablet 0  . b complex vitamins tablet Take 1 tablet by mouth daily.    . clonazePAM (KLONOPIN) 1 MG tablet TAKE 1 TABLET BY MOUTH 2 TIMES DAILY AS NEEDED FOR ANXIETY 60 tablet 2  . diclofenac (VOLTAREN) 75 MG EC tablet Take 1 tablet (75 mg total) by mouth 2 (two) times daily. 60 tablet 0  . ketoconazole (NIZORAL) 2 % shampoo APPLY EXTERNALLY 2 TIMES A WEEK 120 mL 6  . Multiple Vitamin (MULITIVITAMIN WITH MINERALS) TABS Take 1 tablet by mouth daily.     No current facility-administered medications on file prior to visit.    No Known Allergies Social History    Socioeconomic History  . Marital status: Married    Spouse name: Not on file  . Number of children: Not on file  . Years of education: Not on file  . Highest education level: Not on file  Occupational History  . Not on file  Social Needs  . Financial resource strain: Not on file  . Food insecurity:    Worry: Not on file    Inability: Not on file  . Transportation needs:    Medical: Not on file    Non-medical: Not on file  Tobacco Use  . Smoking status: Former Smoker    Last attempt to quit: 01/15/2007    Years since quitting: 10.6  . Smokeless tobacco: Never Used  Substance and Sexual Activity  . Alcohol use: Yes    Comment: weekends  . Drug use: No  . Sexual activity: Yes  Lifestyle  . Physical activity:    Days per week: Not on file    Minutes per session: Not on file  . Stress: Not on file  Relationships  . Social connections:    Talks on phone: Not on file    Gets together: Not on file    Attends religious service: Not on file    Active member of club or organization: Not on file    Attends meetings of clubs or  organizations: Not on file    Relationship status: Not on file  . Intimate partner violence:    Fear of current or ex partner: Not on file    Emotionally abused: Not on file    Physically abused: Not on file    Forced sexual activity: Not on file  Other Topics Concern  . Not on file  Social History Narrative  . Not on file      Review of Systems  All other systems reviewed and are negative.      Objective:   Physical Exam  Constitutional: He is oriented to person, place, and time. He appears well-developed and well-nourished. No distress.  HENT:  Head: Normocephalic and atraumatic.  Right Ear: External ear normal.  Left Ear: External ear normal.  Nose: Nose normal.  Mouth/Throat: Posterior oropharyngeal erythema present. No oropharyngeal exudate or posterior oropharyngeal edema.  Eyes: Pupils are equal, round, and reactive to light.  Conjunctivae and EOM are normal. No scleral icterus.  Neck: Normal range of motion. No thyromegaly present.  Cardiovascular: Normal rate, regular rhythm and normal heart sounds. Exam reveals no gallop and no friction rub.  No murmur heard. Pulmonary/Chest: Effort normal and breath sounds normal. No stridor. No respiratory distress. He has no wheezes. He has no rales. He exhibits no tenderness.  Abdominal: Soft. Bowel sounds are normal. He exhibits no distension and no mass. There is no tenderness. There is no rebound and no guarding.  Musculoskeletal: Normal range of motion. He exhibits no edema.  Lymphadenopathy:    He has no cervical adenopathy.  Neurological: He is alert and oriented to person, place, and time. He displays normal reflexes. No cranial nerve deficit or sensory deficit. He exhibits normal muscle tone. Coordination normal.  Skin: Skin is warm and dry. No rash noted. He is not diaphoretic. No erythema. No pallor.          Assessment & Plan:  Pharyngitis, unspecified etiology - Plan: STREP GROUP A AG, W/REFLEX TO CULT  Fatigue, unspecified type  Malaise  Viral syndrome  Patient's physical exam is significant only for mild erythema the posterior oropharynx.  Given the dull headache, the erythema, the sore throat, and the malaise, the fatigue, feel the most likely explanation is a viral syndrome from an upper respiratory infection such as adenovirus, rhinovirus, parainfluenza virus, etc.  I will perform a strep screen today.  If strep screen is negative, I would recommend ibuprofen, push fluids, rest, and I would anticipate gradual spontaneous resolution over the next 48 to 72 hours.  Certainly if symptoms change or worsen in any way, the patient is to seek medical attention immediately however his exam today is relatively benign

## 2017-08-22 ENCOUNTER — Encounter: Payer: Self-pay | Admitting: Family Medicine

## 2017-08-22 ENCOUNTER — Other Ambulatory Visit: Payer: Self-pay | Admitting: Family Medicine

## 2017-08-22 MED ORDER — AMPHETAMINE-DEXTROAMPHETAMINE 30 MG PO TABS: 30.0000 mg | ORAL_TABLET | Freq: Two times a day (BID) | ORAL | 0 refills | Status: DC

## 2017-08-22 NOTE — Progress Notes (Signed)
See patient email, trial of 30mg  BID on next fill

## 2017-08-23 LAB — STREP GROUP A AG, W/REFLEX TO CULT: STREPTOCOCCUS, GROUP A SCREEN (DIRECT): NOT DETECTED

## 2017-08-23 LAB — CULTURE, GROUP A STREP
MICRO NUMBER: 90939903
SPECIMEN QUALITY: ADEQUATE

## 2017-09-08 ENCOUNTER — Other Ambulatory Visit: Payer: Self-pay | Admitting: Family Medicine

## 2017-09-08 MED ORDER — AMPHETAMINE-DEXTROAMPHETAMINE 30 MG PO TABS: 30.0000 mg | ORAL_TABLET | Freq: Two times a day (BID) | ORAL | 0 refills | Status: DC

## 2017-09-08 NOTE — Telephone Encounter (Signed)
Received call from patient.   Reports that he is going out of town and requested refill on Adderall.  Ok to refill??  Last office visit 08/21/2017.  Last refill 08/22/2017.  Also requested refill on Diclofenac for elbow.

## 2017-09-08 NOTE — Telephone Encounter (Signed)
[facility]

## 2017-10-09 ENCOUNTER — Other Ambulatory Visit: Payer: Self-pay | Admitting: *Deleted

## 2017-10-09 MED ORDER — AMPHETAMINE-DEXTROAMPHETAMINE 30 MG PO TABS: 30.0000 mg | ORAL_TABLET | Freq: Two times a day (BID) | ORAL | 0 refills | Status: DC

## 2017-10-09 NOTE — Telephone Encounter (Signed)
Received fax requesting refill on Adderall.  Ok to refill??  Last office visit 08/21/2017.  Last refill 09/08/2017.

## 2017-10-10 ENCOUNTER — Other Ambulatory Visit: Payer: Self-pay | Admitting: Family Medicine

## 2017-10-10 NOTE — Telephone Encounter (Signed)
Requesting refill    Klonopin  LOV: 07/22/17  LRF:

## 2017-10-21 ENCOUNTER — Other Ambulatory Visit: Payer: Self-pay | Admitting: *Deleted

## 2017-10-21 ENCOUNTER — Encounter: Payer: Self-pay | Admitting: Family Medicine

## 2017-10-21 DIAGNOSIS — M7711 Lateral epicondylitis, right elbow: Secondary | ICD-10-CM

## 2017-10-21 DIAGNOSIS — M7712 Lateral epicondylitis, left elbow: Principal | ICD-10-CM

## 2017-11-13 ENCOUNTER — Other Ambulatory Visit: Payer: Self-pay | Admitting: Family Medicine

## 2017-11-14 ENCOUNTER — Other Ambulatory Visit: Payer: Self-pay | Admitting: *Deleted

## 2017-11-14 MED ORDER — AMPHETAMINE-DEXTROAMPHETAMINE 30 MG PO TABS: 30.0000 mg | ORAL_TABLET | Freq: Two times a day (BID) | ORAL | 0 refills | Status: DC

## 2017-11-14 NOTE — Telephone Encounter (Signed)
Received call from patient.   Requested refill on Adderall.   Ok to refill??  Last office visit 08/21/2017.  Last refill 10/09/2017.

## 2017-12-17 ENCOUNTER — Other Ambulatory Visit: Payer: Self-pay | Admitting: Family Medicine

## 2017-12-17 MED ORDER — AMPHETAMINE-DEXTROAMPHETAMINE 30 MG PO TABS: 30.0000 mg | ORAL_TABLET | Freq: Two times a day (BID) | ORAL | 0 refills | Status: DC

## 2017-12-17 NOTE — Telephone Encounter (Signed)
Requested refill on Adderall.   Ok to refill??  Last office visit 08/21/2017.  Last refill - 11/14/17

## 2018-01-15 ENCOUNTER — Other Ambulatory Visit: Payer: Self-pay | Admitting: Family Medicine

## 2018-01-15 NOTE — Telephone Encounter (Signed)
Ok to refill??  Last office visit 08/21/2017.  Last refill 12/17/2017.

## 2018-01-16 ENCOUNTER — Other Ambulatory Visit: Payer: Self-pay | Admitting: *Deleted

## 2018-01-16 MED ORDER — CLONAZEPAM 1 MG PO TABS: ORAL_TABLET | ORAL | 0 refills | Status: DC

## 2018-01-16 NOTE — Telephone Encounter (Signed)
Received fax requesting refill on Clonazepam.  Ok to refill??  Last office visit 08/21/2017.  Last refill 10/10/2017.

## 2018-01-18 ENCOUNTER — Emergency Department (HOSPITAL_COMMUNITY)
Admission: EM | Admit: 2018-01-18 | Discharge: 2018-01-18 | Disposition: A | Discharge status: Home / Self Care | Payer: BLUE CROSS/BLUE SHIELD | Attending: Emergency Medicine | Admitting: Emergency Medicine

## 2018-01-18 ENCOUNTER — Encounter (HOSPITAL_COMMUNITY): Payer: Self-pay | Admitting: Emergency Medicine

## 2018-01-18 DIAGNOSIS — M5416 Radiculopathy, lumbar region: Secondary | ICD-10-CM | POA: Insufficient documentation

## 2018-01-18 DIAGNOSIS — M545 Low back pain: Secondary | ICD-10-CM | POA: Diagnosis present

## 2018-01-18 DIAGNOSIS — Z87891 Personal history of nicotine dependence: Secondary | ICD-10-CM | POA: Insufficient documentation

## 2018-01-18 DIAGNOSIS — Z79899 Other long term (current) drug therapy: Secondary | ICD-10-CM | POA: Insufficient documentation

## 2018-01-18 MED ORDER — PREDNISONE 20 MG PO TABS: ORAL_TABLET | ORAL | 0 refills | Status: DC

## 2018-01-18 MED ORDER — HYDROCODONE-ACETAMINOPHEN 5-325 MG PO TABS
1.0000 | ORAL_TABLET | Freq: Once | ORAL | Status: AC
  Administered 2018-01-18: 1 via ORAL
  Filled 2018-01-18: qty 1

## 2018-01-18 MED ORDER — HYDROCODONE-ACETAMINOPHEN 5-325 MG PO TABS: 1.0000 | ORAL_TABLET | Freq: Four times a day (QID) | ORAL | 0 refills | Status: DC | PRN

## 2018-01-18 NOTE — ED Triage Notes (Signed)
Patient to ED c/o worsening R lower back pain (hx back pain) radiating down R buttock and leg since Thursday night, denies new injury. He reports new numbness to R side of groin today as well as trouble urinating. Ambulatory in room with steady gait, but unable to sit d/t pain.

## 2018-01-18 NOTE — ED Notes (Signed)
Patient verbalizes understanding of discharge instructions. Opportunity for questioning and answers were provided. Armband removed by staff, pt discharged from ED ambulatory.   

## 2018-01-18 NOTE — Discharge Instructions (Addendum)
Do not take prednisone together with naproxen, ibuprofen or the class of drugs known as NSAIDs, as the combination can cause bleeding ulcers.  Do not take the pain medicine prescribed together with clonazepam (Klonopin), as the combination can interfere with breathing and be dangerous.  See your primary care provider if you continue to have severe discomfort in 2 or 3 days.

## 2018-01-18 NOTE — ED Provider Notes (Signed)
[facility] EMERGENCY DEPARTMENT Provider Note   CSN: [id number] Arrival date & time: 01/18/18  1603     History   Chief Complaint No chief complaint on file.   HPI Maurice Sims is a 48 y.o. male.  HPI Complains of low back pain radiating to right foot, severe for the past 4 days.  He does suffer from chronic back pain which radiates to his right buttock, though not as severe.  Pain is worse when he sits down and improves when he stands up.  He has been treated with ibuprofen, naproxen, Flexeril, without adequate relief.  He denies loss of bladder or bowel control.  He states his bladder currently feels empty.  He denies fever.  Denies recent trauma.  No other associated symptoms Past Medical History:  Diagnosis Date  . Anxiety   . Diverticulitis     Patient Active Problem List   Diagnosis Date Noted  . ADD (attention deficit disorder) 06/12/2016  . GAD (generalized anxiety disorder) 01/02/2015  . Chronic insomnia 04/26/2014  . Pain in joint, shoulder region 12/28/2013  . Diverticulitis 05/18/2013  . Abdominal pain, unspecified site 05/18/2013  . Tinea versicolor 11/19/2012    Past Surgical History:  Procedure Laterality Date  . ELBOW SURGERY    . HERNIA REPAIR          Home Medications    Prior to Admission medications   Medication Sig Start Date End Date Taking? Authorizing Provider  amphetamine-dextroamphetamine (ADDERALL) 30 MG tablet TAKE 1 TABLET BY MOUTH 2 TIMES DAILY Patient taking differently: Take 30 mg by mouth 2 (two) times daily.  01/16/18  Yes Garland, Velna Hatchet, MD  b complex vitamins tablet Take 1 tablet by mouth daily.   Yes [provider]  clonazePAM (KLONOPIN) 1 MG tablet TAKE 1 TABLET BY MOUTH 2 TIMES DAILY AS NEEDED FOR ANXIETY Patient taking differently: Take 1 mg by mouth 2 (two) times daily as needed for anxiety.  01/16/18  Yes Lonepine, Velna Hatchet, MD  diclofenac (VOLTAREN) 75 MG EC tablet TAKE 1 TABLET BY  MOUTH 2 TIMES DAILY Patient taking differently: Take 75 mg by mouth 2 (two) times daily.  11/13/17  Yes Hickman, Velna Hatchet, MD  ibuprofen (ADVIL,MOTRIN) 200 MG tablet Take 400 mg by mouth every 4 (four) hours as needed for mild pain.   Yes [provider]  Multiple Vitamin (MULITIVITAMIN WITH MINERALS) TABS Take 1 tablet by mouth daily.   Yes [provider]  HYDROcodone-acetaminophen (NORCO) 5-325 MG tablet Take 1-2 tablets by mouth every 6 (six) hours as needed for severe pain. 01/18/18   Doug Sou, MD  predniSONE (DELTASONE) 20 MG tablet 3 tabs po daily x 5 days 01/18/18   Doug Sou, MD    Family History No family history on file.  Social History Social History   Tobacco Use  . Smoking status: Former Smoker    Last attempt to quit: 01/15/2007    Years since quitting: 11.0  . Smokeless tobacco: Never Used  Substance Use Topics  . Alcohol use: Yes    Comment: weekends  . Drug use: No     Allergies   Patient has no known allergies.   Review of Systems Review of Systems  Musculoskeletal: Positive for back pain.  All other systems reviewed and are negative.    Physical Exam Updated Vital Signs BP (!) 126/97 (BP Location: Right Arm)   Pulse 90   Temp 98.9 F (37.2 C) (Oral)  Resp 20   SpO2 95%   Physical Exam Vitals signs and nursing note reviewed.  Constitutional:      Appearance: He is well-developed.  HENT:     Head: Normocephalic and atraumatic.  Eyes:     Conjunctiva/sclera: Conjunctivae normal.     Pupils: Pupils are equal, round, and reactive to light.  Neck:     Musculoskeletal: Neck supple.     Thyroid: No thyromegaly.     Trachea: No tracheal deviation.  Cardiovascular:     Rate and Rhythm: Normal rate and regular rhythm.     Heart sounds: No murmur.  Pulmonary:     Effort: Pulmonary effort is normal.     Breath sounds: Normal breath sounds.  Abdominal:     General: Bowel sounds are normal. There is no distension.      Palpations: Abdomen is soft.     Tenderness: There is no abdominal tenderness.  Musculoskeletal: Normal range of motion.        General: No tenderness.     Comments: enTire spine is nontender.  He has pain at lumbar area when he sits down from a standing position.  All 4 extremities without redness swelling or tenderness neurovascular intact  Skin:    General: Skin is warm and dry.     Findings: No rash.  Neurological:     Mental Status: He is alert.     Coordination: Coordination normal.     Comments: Gait normal.  He is able to walk on his tiptoes without difficulty.  Motor strength 5/5 overall DTR symmetric bilaterally at knee jerk ankle jerk and biceps toes downward going bilaterally      ED Treatments / Results  Labs (all labs ordered are listed, but only abnormal results are displayed) Labs Reviewed - No data to display  EKG None  Radiology No results found.  Procedures Procedures (including critical care time)  Medications Ordered in ED Medications  HYDROcodone-acetaminophen (NORCO/VICODIN) 5-325 MG per tablet 1 tablet (1 tablet Oral Given 01/18/18 1713)    Initial Impression / Assessment and Plan / ED Course  I have reviewed the triage vital signs and the nursing notes.  Pertinent labs & imaging results that were available during my care of the patient were reviewed by me and considered in my medical decision making (see chart for details).    Marjan imaging not indicated discussed with patient who agrees.  Plan discontinue NSAIDs, prescription prednisone.  He is advised not to take Norco together with clonazepam.  Follow-up with PMD if pain not well controlled in a few days.   Final Clinical Impressions(s) / ED Diagnoses   Final diagnoses:  Lumbar radiculopathy    ED Discharge Orders         Ordered    predniSONE (DELTASONE) 20 MG tablet     01/18/18 1719    HYDROcodone-acetaminophen (NORCO) 5-325 MG tablet  Every 6 hours PRN     01/18/18 1719            Doug Sou, MD 01/18/18 1726

## 2018-01-19 ENCOUNTER — Telehealth: Payer: Self-pay | Admitting: *Deleted

## 2018-01-19 NOTE — Telephone Encounter (Signed)
Pharmacy called related to Rx: hydrocodone as pt is on adderall and klonopin and the addition of hydrocodone is not recommended .Marland KitchenMarland KitchenEDCM clarified with EDP (Hedges) to fill as written.  EDP Jacobowitz counseled pt not to take Rx with klonopin.

## 2018-01-20 ENCOUNTER — Ambulatory Visit
Admission: RE | Admit: 2018-01-20 | Discharge: 2018-01-20 | Disposition: A | Discharge status: Home / Self Care | Payer: BLUE CROSS/BLUE SHIELD | Source: Ambulatory Visit | Attending: Family Medicine | Admitting: Family Medicine

## 2018-01-20 ENCOUNTER — Encounter: Payer: Self-pay | Admitting: Family Medicine

## 2018-01-20 DIAGNOSIS — M545 Low back pain, unspecified: Secondary | ICD-10-CM

## 2018-01-20 DIAGNOSIS — M5416 Radiculopathy, lumbar region: Secondary | ICD-10-CM

## 2018-01-21 ENCOUNTER — Other Ambulatory Visit: Payer: Self-pay | Admitting: *Deleted

## 2018-01-21 ENCOUNTER — Encounter: Payer: Self-pay | Admitting: Family Medicine

## 2018-01-21 MED ORDER — HYDROCODONE-ACETAMINOPHEN 5-325 MG PO TABS: 1.0000 | ORAL_TABLET | Freq: Four times a day (QID) | ORAL | 0 refills | Status: DC | PRN

## 2018-01-21 NOTE — Telephone Encounter (Signed)
Received call from patient.   Reports that he was given Norco for pain in ED. States that he only has a few left and is requesting refill to make it to Friday appt.   Ok to refill?

## 2018-01-23 ENCOUNTER — Encounter: Payer: Self-pay | Admitting: Family Medicine

## 2018-01-23 ENCOUNTER — Other Ambulatory Visit: Payer: Self-pay

## 2018-01-23 ENCOUNTER — Ambulatory Visit: Payer: BLUE CROSS/BLUE SHIELD | Admitting: Family Medicine

## 2018-01-23 VITALS — BP 130/74 | HR 80 | Temp 98.7°F | Resp 14 | Ht 69.0 in | Wt 180.0 lb

## 2018-01-23 DIAGNOSIS — F411 Generalized anxiety disorder: Secondary | ICD-10-CM

## 2018-01-23 DIAGNOSIS — M5410 Radiculopathy, site unspecified: Secondary | ICD-10-CM

## 2018-01-23 DIAGNOSIS — F5104 Psychophysiologic insomnia: Secondary | ICD-10-CM | POA: Diagnosis not present

## 2018-01-23 DIAGNOSIS — M9979 Connective tissue and disc stenosis of intervertebral foramina of abdomen and other regions: Secondary | ICD-10-CM

## 2018-01-23 DIAGNOSIS — M8538 Osteitis condensans, other site: Secondary | ICD-10-CM

## 2018-01-23 DIAGNOSIS — M5136 Other intervertebral disc degeneration, lumbar region: Secondary | ICD-10-CM

## 2018-01-23 DIAGNOSIS — F900 Attention-deficit hyperactivity disorder, predominantly inattentive type: Secondary | ICD-10-CM

## 2018-01-23 DIAGNOSIS — E785 Hyperlipidemia, unspecified: Secondary | ICD-10-CM

## 2018-01-23 LAB — COMPREHENSIVE METABOLIC PANEL
AG RATIO: 1.8 (calc) (ref 1.0–2.5)
ALT: 40 U/L (ref 9–46)
AST: 27 U/L (ref 10–40)
Albumin: 4.6 g/dL (ref 3.6–5.1)
Alkaline phosphatase (APISO): 52 U/L (ref 40–115)
BILIRUBIN TOTAL: 0.3 mg/dL (ref 0.2–1.2)
BUN: 21 mg/dL (ref 7–25)
CALCIUM: 10.5 mg/dL — AB (ref 8.6–10.3)
CHLORIDE: 103 mmol/L (ref 98–110)
CO2: 28 mmol/L (ref 20–32)
Creat: 0.97 mg/dL (ref 0.60–1.35)
Globulin: 2.6 g/dL (calc) (ref 1.9–3.7)
Glucose, Bld: 80 mg/dL (ref 65–99)
Potassium: 4.3 mmol/L (ref 3.5–5.3)
Sodium: 138 mmol/L (ref 135–146)
Total Protein: 7.2 g/dL (ref 6.1–8.1)

## 2018-01-23 LAB — LIPID PANEL
Cholesterol: 212 mg/dL — ABNORMAL HIGH (ref <200)
HDL: 65 mg/dL (ref >40)
LDL Cholesterol (Calc): 119 mg/dL (calc) — ABNORMAL HIGH
Non-HDL Cholesterol (Calc): 147 mg/dL (calc) — ABNORMAL HIGH (ref <130)
Total CHOL/HDL Ratio: 3.3 (calc) (ref <5.0)
Triglycerides: 161 mg/dL — ABNORMAL HIGH (ref <150)

## 2018-01-23 MED ORDER — AMPHETAMINE-DEXTROAMPHETAMINE 30 MG PO TABS: 1.0000 | ORAL_TABLET | Freq: Two times a day (BID) | ORAL | 0 refills | Status: DC

## 2018-01-23 MED ORDER — CLONAZEPAM 1 MG PO TABS: ORAL_TABLET | ORAL | 1 refills | Status: DC

## 2018-01-23 MED ORDER — CYCLOBENZAPRINE HCL 10 MG PO TABS: 10.0000 mg | ORAL_TABLET | Freq: Three times a day (TID) | ORAL | 0 refills | Status: DC | PRN

## 2018-01-23 MED ORDER — DICLOFENAC SODIUM 75 MG PO TBEC: 75.0000 mg | DELAYED_RELEASE_TABLET | Freq: Two times a day (BID) | ORAL | 0 refills | Status: DC

## 2018-01-23 NOTE — Assessment & Plan Note (Signed)
He has severe back pain with the osteitis found on his x-ray along with disc base narrowing concerning for possible either displaced lumbar disc or spinal stenosis as well as this increased bone density that can cause severe back pain.  We will obtain an MRI to further differentiate things.  He still in significant pain has limited mobility.  He will be out of work for another week as well.  He will complete his prednisone today then he will start diclofenac twice a day along with Flexeril at bedtime and continue the hydrocodone until this runs out then I can put him on oxycodone 5-325mg . He is not taking his clonazepam at this time or the Adderall he is aware of intermixing of these medications.  He can however get these refilled as these are his chronic medications for his ADD and his anxiety/insomnia  I am going to check his cholesterol again as well

## 2018-01-23 NOTE — Patient Instructions (Signed)
Take muscle relaxer and hydrocodone  Start diclofenac once you complete the steroids MRI to be done We will call with labs F/U 6 months for Physical

## 2018-01-23 NOTE — Progress Notes (Signed)
Subjective:    Patient ID: Maurice Sims, male    DOB: 03/20/1970, 48 y.o.   MRN: [id number]  Patient presents for Follow-up (is not fasting) and Back Pain Patient here to follow-up chronic medical problems as well as ER visit for back pain.  He was seen in the ER after having severe sided back pain with radicular symptoms.  He was given prednisone and hydrocodone.  He did not have any imaging.  He called back still having severe pain he had been to a chiropractor try to see if get an adjustment would help.  Over they were unable to do very much because of the severity of his pain and not having imaging x-ray obtained showed IMPRESSION: Disc space narrowing at L5-S1. Other disc spaces appear normal. No fracture or spondylolisthesis. There is osteitis condensans ilia on the right.  Not had any change in bowel or bladder .  He is still taking the hydrocodone for pain this helps a little.  He still gets significant spasms and pain radiating down the right leg with tingling and numbness.  The pain is so severe he has to stand most of the day.  Sitting in any manner is quite severe.  He is unable to drive at this time.  He has not been to work this entire week.   ADD he continues to take Adderall without any difficulty this continues to help him focus with his job, not been taking recently because his pharmacist would not fill this with him being on the pain medication.  Note he is actually moving to [facility] since he has gotten married in the past couple months and has a new pharmacy he needs to switch his medications to.  Anxiety - He takes clonazepam as needed he does not take this with his pain medication  Hyperliopidemia LDL 168 in July, due for repeat   Review Of Systems:  GEN- denies fatigue, fever, weight loss,weakness, recent illness HEENT- denies eye drainage, change in vision, nasal discharge, CVS- denies chest pain, palpitations RESP- denies SOB, cough, wheeze ABD-  denies N/V, change in stools, abd pain GU- denies dysuria, hematuria, dribbling, incontinence MSK- + joint pain, muscle aches, injury Neuro- denies headache, dizziness, syncope, seizure activity       Objective:    BP 130/74   Pulse 80   Temp 98.7 F (37.1 C) (Oral)   Resp 14   Ht 5\' 9"  (1.753 m)   Wt 180 lb (81.6 kg)   SpO2 99%   BMI 26.58 kg/m  GEN- NAD, alert and oriented x3 HEENT- PERRL, EOMI, non injected sclera, pink conjunctiva, MMM, oropharynx clear CVS- RRR, no murmur RESP-CTAB ABD-NABS,soft,NT,ND MSK-Spine TTP lumbar spine, + paraspinal tenderness and spasm, +SLR, antalgic gait, decreased FROM lumbar spine Neuro- sensation in tact LE, DTR decreased right compared to left, strength decreased RLE compared to left  Psych- normal affect and mood  EXT- No edema Pulses- Radial, DP- 2+        Assessment & Plan:      Problem List Items Addressed This Visit      Unprioritized   ADD (attention deficit disorder) - Primary   Chronic insomnia   GAD (generalized anxiety disorder)   Osteitis condensans ilii    He has severe back pain with the osteitis found on his x-ray along with disc base narrowing concerning for possible either displaced lumbar disc or spinal stenosis as well as this increased bone density that can cause severe back pain.  We will obtain an MRI to further differentiate things.  He still in significant pain has limited mobility.  He will be out of work for another week as well.  He will complete his prednisone today then he will start diclofenac twice a day along with Flexeril at bedtime and continue the hydrocodone until this runs out then I can put him on oxycodone 5-325mg . He is not taking his clonazepam at this time or the Adderall he is aware of intermixing of these medications.  He can however get these refilled as these are his chronic medications for his ADD and his anxiety/insomnia  I am going to check his cholesterol again as well        Other Visit Diagnoses    Hyperlipidemia, unspecified hyperlipidemia type       Relevant Orders   Lipid panel   Comprehensive metabolic panel   Narrowing of intervertebral disc space          Note: This dictation was prepared with Dragon dictation along with smaller phrase technology. Any transcriptional errors that result from this process are unintentional.

## 2018-01-25 ENCOUNTER — Encounter: Payer: Self-pay | Admitting: Family Medicine

## 2018-01-26 MED ORDER — OXYCODONE-ACETAMINOPHEN 5-325 MG PO TABS: 1.0000 | ORAL_TABLET | Freq: Three times a day (TID) | ORAL | 0 refills | Status: DC | PRN

## 2018-01-26 NOTE — Telephone Encounter (Signed)
Call placed to patient and patient made aware.  

## 2018-01-26 NOTE — Telephone Encounter (Signed)
May have had some worsening of symptoms coming off the prednisone Take the diclofenac instead Percocet called in MRI is pending approval from insurance, documentation has been sent

## 2018-01-28 ENCOUNTER — Encounter: Payer: Self-pay | Admitting: Family Medicine

## 2018-01-28 ENCOUNTER — Other Ambulatory Visit: Payer: Self-pay | Admitting: Family Medicine

## 2018-01-28 DIAGNOSIS — M5136 Other intervertebral disc degeneration, lumbar region: Secondary | ICD-10-CM

## 2018-01-28 DIAGNOSIS — M5410 Radiculopathy, site unspecified: Secondary | ICD-10-CM

## 2018-01-28 DIAGNOSIS — M8538 Osteitis condensans, other site: Secondary | ICD-10-CM

## 2018-01-28 NOTE — Progress Notes (Signed)
Call pt, his insurance denied MRI stating he needs to do Physical therapy first I am concerned about just putting him in PT first, so will send him to spine specialist first

## 2018-01-30 ENCOUNTER — Other Ambulatory Visit: Payer: Self-pay | Admitting: Family Medicine

## 2018-01-30 DIAGNOSIS — T1590XA Foreign body on external eye, part unspecified, unspecified eye, initial encounter: Secondary | ICD-10-CM

## 2018-02-01 ENCOUNTER — Encounter: Payer: Self-pay | Admitting: Family Medicine

## 2018-02-05 ENCOUNTER — Other Ambulatory Visit: Payer: Self-pay | Admitting: Student

## 2018-02-05 ENCOUNTER — Encounter: Payer: Self-pay | Admitting: Family Medicine

## 2018-02-05 DIAGNOSIS — M5416 Radiculopathy, lumbar region: Secondary | ICD-10-CM

## 2018-02-12 ENCOUNTER — Other Ambulatory Visit: Payer: Self-pay | Admitting: Family Medicine

## 2018-02-12 ENCOUNTER — Other Ambulatory Visit: Payer: Self-pay | Admitting: Student

## 2018-02-12 DIAGNOSIS — T1590XA Foreign body on external eye, part unspecified, unspecified eye, initial encounter: Secondary | ICD-10-CM

## 2018-02-13 ENCOUNTER — Ambulatory Visit
Admission: RE | Admit: 2018-02-13 | Discharge: 2018-02-13 | Disposition: A | Discharge status: Home / Self Care | Payer: BLUE CROSS/BLUE SHIELD | Source: Ambulatory Visit | Attending: Student | Admitting: Student

## 2018-02-13 DIAGNOSIS — T1590XA Foreign body on external eye, part unspecified, unspecified eye, initial encounter: Secondary | ICD-10-CM

## 2018-02-13 DIAGNOSIS — M5416 Radiculopathy, lumbar region: Secondary | ICD-10-CM

## 2018-02-18 ENCOUNTER — Other Ambulatory Visit: Payer: Self-pay | Admitting: Family Medicine

## 2018-02-27 ENCOUNTER — Other Ambulatory Visit: Payer: Self-pay | Admitting: Family Medicine

## 2018-02-27 MED ORDER — AMPHETAMINE-DEXTROAMPHETAMINE 30 MG PO TABS: 1.0000 | ORAL_TABLET | Freq: Two times a day (BID) | ORAL | 0 refills | Status: DC

## 2018-02-27 NOTE — Telephone Encounter (Signed)
Adderall refill request.  Last seen 01/23/2018.  Please advise.

## 2018-02-27 NOTE — Telephone Encounter (Signed)
Refill on adderall to [facility] [place].

## 2018-03-20 ENCOUNTER — Other Ambulatory Visit: Payer: Self-pay | Admitting: Family Medicine

## 2018-03-26 ENCOUNTER — Other Ambulatory Visit: Payer: Self-pay | Admitting: *Deleted

## 2018-03-26 MED ORDER — AMPHETAMINE-DEXTROAMPHETAMINE 30 MG PO TABS: 1.0000 | ORAL_TABLET | Freq: Two times a day (BID) | ORAL | 0 refills | Status: DC

## 2018-03-26 NOTE — Telephone Encounter (Signed)
Received call from patient.   Requested refill on Adderall.   Ok to refill??  Last office visit 01/23/2018.  Last refill 02/27/2018.

## 2018-04-28 ENCOUNTER — Other Ambulatory Visit: Payer: Self-pay | Admitting: Family Medicine

## 2018-04-28 MED ORDER — AMPHETAMINE-DEXTROAMPHETAMINE 30 MG PO TABS: 1.0000 | ORAL_TABLET | Freq: Two times a day (BID) | ORAL | 0 refills | Status: DC

## 2018-04-28 NOTE — Telephone Encounter (Signed)
Ok to refill??  Last office visit 01/23/2018.  Last refill 03/26/2018.

## 2018-04-28 NOTE — Telephone Encounter (Signed)
[facility] [place]  Patient is calling to get refill on his adderall

## 2018-05-19 ENCOUNTER — Other Ambulatory Visit: Payer: Self-pay | Admitting: Family Medicine

## 2018-05-19 NOTE — Telephone Encounter (Signed)
Ok to refill??  Last office visit/ refill 01/23/2018, #1 refills.

## 2018-05-28 ENCOUNTER — Other Ambulatory Visit: Payer: Self-pay | Admitting: Family Medicine

## 2018-05-28 NOTE — Telephone Encounter (Signed)
Ok to refill??  Last office visit 01/23/2018.  Last refill 04/28/2018.

## 2018-05-29 ENCOUNTER — Other Ambulatory Visit: Payer: Self-pay | Admitting: *Deleted

## 2018-05-29 MED ORDER — DICLOFENAC SODIUM 75 MG PO TBEC: 75.0000 mg | DELAYED_RELEASE_TABLET | Freq: Two times a day (BID) | ORAL | 3 refills | Status: DC

## 2018-05-29 MED ORDER — AMPHETAMINE-DEXTROAMPHETAMINE 30 MG PO TABS: 1.0000 | ORAL_TABLET | Freq: Two times a day (BID) | ORAL | 0 refills | Status: DC

## 2018-06-29 ENCOUNTER — Other Ambulatory Visit: Payer: Self-pay | Admitting: *Deleted

## 2018-06-29 ENCOUNTER — Other Ambulatory Visit: Payer: Self-pay | Admitting: Family Medicine

## 2018-06-29 MED ORDER — AMPHETAMINE-DEXTROAMPHETAMINE 30 MG PO TABS: 1.0000 | ORAL_TABLET | Freq: Two times a day (BID) | ORAL | 0 refills | Status: DC

## 2018-06-29 NOTE — Telephone Encounter (Signed)
Received call from patient.   Requested refill on Adderall to go to [facility].   Ok to refill??  Last office visit 01/23/2018.  Last refill 05/29/2018.

## 2018-06-29 NOTE — Telephone Encounter (Signed)
Schedule for OV

## 2018-07-29 ENCOUNTER — Ambulatory Visit (INDEPENDENT_AMBULATORY_CARE_PROVIDER_SITE_OTHER): Payer: BC Managed Care – PPO | Admitting: Family Medicine

## 2018-07-29 ENCOUNTER — Encounter: Payer: Self-pay | Admitting: Family Medicine

## 2018-07-29 ENCOUNTER — Other Ambulatory Visit: Payer: Self-pay

## 2018-07-29 DIAGNOSIS — R6889 Other general symptoms and signs: Secondary | ICD-10-CM | POA: Diagnosis not present

## 2018-07-29 DIAGNOSIS — Z20822 Contact with and (suspected) exposure to covid-19: Secondary | ICD-10-CM

## 2018-07-29 DIAGNOSIS — R509 Fever, unspecified: Secondary | ICD-10-CM | POA: Diagnosis not present

## 2018-07-29 DIAGNOSIS — K529 Noninfective gastroenteritis and colitis, unspecified: Secondary | ICD-10-CM

## 2018-07-29 MED ORDER — ONDANSETRON 4 MG PO TBDP: 4.0000 mg | ORAL_TABLET | Freq: Three times a day (TID) | ORAL | 0 refills | Status: DC | PRN

## 2018-07-29 MED ORDER — DICYCLOMINE HCL 20 MG PO TABS: 20.0000 mg | ORAL_TABLET | Freq: Three times a day (TID) | ORAL | 0 refills | Status: DC | PRN

## 2018-07-29 NOTE — Patient Instructions (Addendum)
Avoid immodium or pepto bismo Hydrate with clear fluids and try to eat some bland salty crackers Rest, use tylenol and/or ibuprofen for pain/fever Can use zofran for nausea - may help settle stomach Can try bentyl for abdominal cramping  See area below put all in bold for concerning signs and symptoms that you should go to the ER for.    Viral Gastroenteritis, Adult  Viral gastroenteritis is also known as the stomach flu. This condition may affect your stomach, your small intestine, and your large intestine. It can cause sudden watery poop (diarrhea), fever, and throwing up (vomiting). This condition is caused by certain germs (viruses). These germs can be passed from person to person very easily (are contagious). Having watery poop and throwing up can make you feel weak and cause you to not have enough water in your body (get dehydrated). This can make you tired and thirsty, make you have a dry mouth, and make it so you pee (urinate) less often. It is important to replace the fluids that you lose from having watery poop and throwing up. What are the causes?  You can get sick by catching viruses from other people.  You can also get sick by: ? Eating food, drinking water, or touching a surface that has the viruses on it (is contaminated). ? Sharing utensils or other personal items with a person who is sick. What increases the risk?  Having a weak body defense system (immune system).  Living with one or more children who are younger than 49 years old.  Living in a nursing home.  Going on cruise ships. What are the signs or symptoms? Symptoms of this condition start suddenly. Symptoms may last for a few days or for as long as a week.  Common symptoms include: ? Watery poop. ? Throwing up.  Other symptoms include: ? Fever. ? Headache. ? Feeling tired (fatigue). ? Pain in the belly (abdomen). ? Chills. ? Feeling weak. ? Feeling sick to your stomach (nauseous). ? Muscle aches.  ? Not feeling hungry. How is this treated?  This condition typically goes away on its own.  The focus of treatment is to replace the fluids that you lose. This condition may be treated with: ? An ORS (oral rehydration solution). This is a drink that is sold at pharmacies and stores. ? Medicines to help with your symptoms. ? Probiotic supplements to reduce symptoms of diarrhea. ? Fluids given through an IV tube, if needed.  Older adults and people with other diseases or a weak body defense system are at higher risk for not having enough water in the body. Follow these instructions at home: Eating and drinking   Take an ORS as told by your doctor.  Drink clear fluids in small amounts as you are able. Clear fluids include: ? Water. ? Ice chips. ? Fruit juice with water added to it (diluted). ? Low-calorie sports drinks.  Drink enough fluid to keep your pee (urine) pale yellow.  Eat small amounts of healthy foods every 3-4 hours as you are able. This may include whole grains, fruits, vegetables, lean meats, and yogurt.  Avoid fluids that have a lot of sugar or caffeine in them, such as energy drinks, sports drinks, and soda.  Avoid spicy or fatty foods.  Avoid alcohol. General instructions   Wash your hands often. This is very important after you have watery poop or you throw up. If you cannot use soap and water, use hand sanitizer.  Make sure that  all people in your home wash their hands well and often.  Take over-the-counter and prescription medicines only as told by your doctor.  Rest at home while you get better.  Watch your condition for any changes.  Take a warm bath to help with any burning or pain from having watery poop.  Keep all follow-up visits as told by your doctor. This is important. Contact a doctor if:  You cannot keep fluids down.  Your symptoms get worse.  You have new symptoms.  You feel light-headed.  You feel dizzy.  You have muscle  cramps. Get help right away if:  You have chest pain.  You feel very weak.  You pass out (faint).  You see blood in your throw-up.  Your throw-up looks like coffee grounds.  You have bloody or black poop (stools) or poop that looks like tar.  You have a very bad headache, or a stiff neck, or both.  You have a rash.  You have very bad pain, cramping, or bloating in your belly.  You have trouble breathing.  You are breathing very quickly.  You have a fast heartbeat.  Your skin feels cold and clammy.  You feel mixed up (confused).  You have pain when you pee.  You have signs of not having enough water in the body, such as: ? Dark pee, hardly any pee, or no pee. ? Cracked lips. ? Dry mouth. ? Sunken eyes. ? Feeling very sleepy. ? Feeling weak. Summary  Viral gastroenteritis is also known as the stomach flu.  This condition can cause sudden watery poop (diarrhea), fever, and throwing up (vomiting).  These germs can be passed from person to person very easily.  Take an ORS as told by your doctor. This is a drink that is sold at pharmacies and stores.  Drink fluids in small amounts many times each day as you are able. This information is not intended to replace advice given to you by your health care provider. Make sure you discuss any questions you have with your health care provider. Document Released: 06/19/2007 Document Revised: 11/05/2017 Document Reviewed: 11/05/2017 Elsevier Patient Education  2020 [employer].  This information is directly available on the CDC website: RunningShows.co.za.html    Source:CDC Reference to specific commercial products, manufacturers, companies, or trademarks does not constitute its endorsement or recommendation by the [place], [place], or Centers for [employer] and Prevention.

## 2018-07-29 NOTE — Progress Notes (Signed)
Patient ID: JSON KOELZER, male    DOB: 04/20/1970, 48 y.o.   MRN: [id number]  PCP: Alycia Rossetti, MD  Virtual Visit via telephone  Phone visit arranged with Bernerd Pho for 07/29/18 at  2:30 PM EDT  Services provided today were via telemedicine through telephone call. Start of phone call:  2:23 PM  I verified that I was speaking with the correct person using two identifiers. Patient reported their location during encounter was at home   Patient consented to telephone visit  I conducted telephone visit from [place] [facility]  Referring Provider:   Alycia Rossetti, MD   All participants in encounter:  Myself and the patient   I discussed the limitations, risks, security and privacy concerns of performing an evaluation and management service by telephone and the availability of in person appointments. I also discussed with the patient that there may be a patient responsible charge related to this service. The patient expressed understanding and agreed to proceed.  Chief Complaint  Patient presents with   Diarrhea    no cough, slight sore throat, bodyaches, no sob, started 07/14, OTC meds taken   Fever    102 at midnight 07/15, now it is 100.8, not tested for covid   Migraine    Subjective:   Maurice Sims is a 48 y.o. male, with CC of illness that started yesterday with diarrhea and nausea.  Estimates ~ 20 watery stools, no blood or melena.  With abdominal discomfort, bloating, pressure.  No vomiting.  Has watery urgent BM after eating or drinking anything. Had fever Tmax last night 102.1 with some sweats and chills.  Has also had a sore throat, body aches and headache.  He denies any nasal symptoms or coughing. He has been drinking lots of fluids and drinking [place] drink, not eating much, does feel that his stools are getting more frequent and more watery.  He has not urinated much today.  He is extremely fatigued and  exhausted if he gets up to do something around the house he immediately wants to lay down and rest but he denies any shortness of breath palpitations, near syncope.  He does have a history of diverticulitis but this does not feel anything like it he states that he had low abdominal pain and pressure with diarrhea when he had diverticulitis and this is just generalized upset stomach, cramping and bloating that has not changed since its onset and has not localized.  No one else at home with similar symptoms and no known sick contacts  Patient Active Problem List   Diagnosis Date Noted   Osteitis condensans ilii 01/23/2018   ADD (attention deficit disorder) 06/12/2016   GAD (generalized anxiety disorder) 01/02/2015   Chronic insomnia 04/26/2014   Pain in joint, shoulder region 12/28/2013   Diverticulitis 05/18/2013   Abdominal pain, unspecified site 05/18/2013   Tinea versicolor 11/19/2012    Prior to Admission medications   Medication Sig Start Date End Date Taking? Authorizing Provider  amphetamine-dextroamphetamine (ADDERALL) 30 MG tablet Take 1 tablet by mouth 2 (two) times daily. 06/29/18  Yes [place], Modena Nunnery, MD  b complex vitamins tablet Take 1 tablet by mouth daily.   Yes [provider]  clonazePAM (KLONOPIN) 1 MG tablet TAKE 1 TABLET BY MOUTH TWICE DAILY AS NEEDED FOR ANXIETY 05/19/18  Yes [place], Modena Nunnery, MD  diclofenac (VOLTAREN) 75 MG EC tablet TAKE 1 TABLET BY MOUTH TWICE DAILY 06/29/18  Yes  [place], Velna HatchetKawanta F, MD  Multiple Vitamin (MULITIVITAMIN WITH MINERALS) TABS Take 1 tablet by mouth daily.   Yes [provider]    No Known Allergies  Review of Systems  Constitutional: Positive for activity change, appetite change, chills, diaphoresis, fatigue and fever.  HENT: Positive for sore throat.   Eyes: Negative.   Respiratory: Negative.  Negative for cough, chest tightness, shortness of breath and wheezing.   Cardiovascular: Negative.  Negative for  chest pain.  Gastrointestinal: Negative.   Endocrine: Negative.   Genitourinary: Negative.   Musculoskeletal: Positive for myalgias.  Skin: Negative.  Negative for rash.  Allergic/Immunologic: Negative.   Neurological: Positive for weakness and headaches. Negative for dizziness, syncope and light-headedness.  Hematological: Negative.  Does not bruise/bleed easily.  Psychiatric/Behavioral: Negative.   All other systems reviewed and are negative.      Objective:    There were no vitals filed for this visit.    Physical Exam  Limited do to telephone visit Alert, answering questions appropriately Normal phonation, able to speak in full and complete sentences, no audible stridor or wheeze     Assessment & Plan:     ICD-10-CM   1. Fever, unspecified fever cause  R50.9 Novel Coronavirus, NAA ([facility])  [place] testing site only  2. Gastroenteritis  K52.9    suspect viral etiology with some mild URI sx as well, supportive tx, hydration, bentyl and zofran, hold/avoid immodium, COVID test, f/up as needed  3. Suspected 2019 Novel Coronavirus Infection  R68.89    testing for covid with fever, chills, sweats, URI sx and GI sx - which are the most severe.  Does not feel like his diverticulitis in the past     I discussed the assessment and treatment plan with the patient. The patient was provided an opportunity to ask questions and all were answered. The patient agreed with the plan and demonstrated an understanding of the instructions.   The patient was advised to call back or seek an in-person evaluation if the symptoms worsen or if the condition fails to improve as anticipated.  Phone call concluded at 2:42 PM  I provided 19 minutes of non-face-to-face time during this encounter.  Danelle BerryLeisa Lewanda Perea, PA-C 07/29/18 2:23 PM

## 2018-07-30 ENCOUNTER — Encounter: Payer: Self-pay | Admitting: Family Medicine

## 2018-07-30 NOTE — Telephone Encounter (Signed)
Ok to refill??  Last office visit 07/29/2018 (telephone visit)  Last refill on Klonopin 05/19/2018, #1 refill.  Last refill on Adderall 06/29/2018.

## 2018-07-31 ENCOUNTER — Ambulatory Visit (INDEPENDENT_AMBULATORY_CARE_PROVIDER_SITE_OTHER): Payer: BC Managed Care – PPO | Admitting: Family Medicine

## 2018-07-31 ENCOUNTER — Ambulatory Visit: Payer: BC Managed Care – PPO | Admitting: Family Medicine

## 2018-07-31 ENCOUNTER — Other Ambulatory Visit: Payer: Self-pay

## 2018-07-31 DIAGNOSIS — K5792 Diverticulitis of intestine, part unspecified, without perforation or abscess without bleeding: Secondary | ICD-10-CM

## 2018-07-31 MED ORDER — HYDROCODONE-ACETAMINOPHEN 5-325 MG PO TABS: 1.0000 | ORAL_TABLET | Freq: Four times a day (QID) | ORAL | 0 refills | Status: DC | PRN

## 2018-07-31 MED ORDER — CLONAZEPAM 1 MG PO TABS: ORAL_TABLET | ORAL | 1 refills | Status: DC

## 2018-07-31 MED ORDER — AMPHETAMINE-DEXTROAMPHETAMINE 30 MG PO TABS: 1.0000 | ORAL_TABLET | Freq: Two times a day (BID) | ORAL | 0 refills | Status: DC

## 2018-07-31 MED ORDER — METRONIDAZOLE 500 MG PO TABS: 500.0000 mg | ORAL_TABLET | Freq: Three times a day (TID) | ORAL | 0 refills | Status: DC

## 2018-07-31 MED ORDER — CIPROFLOXACIN HCL 500 MG PO TABS: 500.0000 mg | ORAL_TABLET | Freq: Two times a day (BID) | ORAL | 0 refills | Status: DC

## 2018-07-31 NOTE — Progress Notes (Signed)
Patient ID: Maurice Sims, male    DOB: March 29, 1970, 48 y.o.   MRN: [id number]  PCP: Alycia Rossetti, MD  Phone visit arranged with Bernerd Pho for 07/31/18 at 11:00 AM EDT  Services provided today were via telemedicine through telephone call. Start of phone call:  11:16 AM  Patient consented to telephone visit  I conducted telephone visit from [place] [facility]  Referring Provider:   Buelah Manis, Modena Nunnery, MD   All participants in encounter:  Myself and the patient  I discussed the limitations, risks, security and privacy concerns of performing an evaluation and management service by telephone and the availability of in person appointments. I also discussed with the patient that there may be a patient responsible charge related to this service. The patient expressed understanding and agreed to proceed.  No chief complaint on file.   Subjective:   Maurice Sims is a 48 y.o. male follow up phone call for abdominal pain with worse diarrhea and incontinence - "can't control it" and when he stands up there is lower abdominal pressure and pain, starting to be worse on the left lower side, and is starting to feel like when he had diverticulitis in the past. He is taking zofran and nausea is better and no vomiting.  Still mostly doing liquid diet. He says "I don't feel like I'm sick" and he is urinating, not having sweats, chills, near syncope.  No melena or hematochezia.    Patient Active Problem List   Diagnosis Date Noted  . Osteitis condensans ilii 01/23/2018  . ADD (attention deficit disorder) 06/12/2016  . GAD (generalized anxiety disorder) 01/02/2015  . Chronic insomnia 04/26/2014  . Pain in joint, shoulder region 12/28/2013  . Diverticulitis 05/18/2013  . Abdominal pain, unspecified site 05/18/2013  . Tinea versicolor 11/19/2012    Prior to Admission medications   Medication Sig Start Date End Date Taking? Authorizing Provider   amphetamine-dextroamphetamine (ADDERALL) 30 MG tablet Take 1 tablet by mouth 2 (two) times daily. 06/29/18   Alycia Rossetti, MD  b complex vitamins tablet Take 1 tablet by mouth daily.    [provider]  clonazePAM (KLONOPIN) 1 MG tablet TAKE 1 TABLET BY MOUTH TWICE DAILY AS NEEDED FOR ANXIETY 05/19/18   Alycia Rossetti, MD  diclofenac (VOLTAREN) 75 MG EC tablet TAKE 1 TABLET BY MOUTH TWICE DAILY 06/29/18   [place], Modena Nunnery, MD  dicyclomine (BENTYL) 20 MG tablet Take 1 tablet (20 mg total) by mouth 3 (three) times daily with meals as needed for spasms. 07/29/18   Delsa Grana, PA-C  Multiple Vitamin (MULITIVITAMIN WITH MINERALS) TABS Take 1 tablet by mouth daily.    [provider]  ondansetron (ZOFRAN ODT) 4 MG disintegrating tablet Take 1 tablet (4 mg total) by mouth every 8 (eight) hours as needed for nausea or vomiting. 07/29/18   Delsa Grana, PA-C    No Known Allergies  Review of Systems 10 Systems reviewed and are negative for acute change except as noted in the HPI.     Objective:    There were no vitals filed for this visit.    Physical Exam  Limited due to telephone encoutner     Assessment & Plan:     ICD-10-CM   1. Diverticulitis  K57.92 metroNIDAZOLE (FLAGYL) 500 MG tablet    ciprofloxacin (CIPRO) 500 MG tablet    HYDROcodone-acetaminophen (NORCO/VICODIN) 5-325 MG tablet   suspected    Patient calls back after  initial telephone visit 2 days ago, his abdominal symptoms have worsened with more stool frequency pain pressure, starting to feel like diverticulitis.  He is having much more pain but is staying hydrated, urinating, no sweats chills near-syncope or lethargy or confusion.  He feels he could continue to stay at home and wait while treating with antibiotics and pain med and we did review ER precautions and he verbalized understanding of ER follow-up.  I discussed the assessment and treatment plan with the patient. The patient was provided an  opportunity to ask questions and all were answered. The patient agreed with the plan and demonstrated an understanding of the instructions.   The patient was advised to call back or seek an in-person evaluation if the symptoms worsen or if the condition fails to improve as anticipated.  Follow up telephone call with original visit only 2 days ago No Charge Call concluded at 11:28 am  Danelle BerryLeisa Lilliahna Schubring, PA-C 07/31/18 11:16 AM

## 2018-08-01 LAB — NOVEL CORONAVIRUS, NAA: SARS-CoV-2, NAA: NOT DETECTED

## 2018-08-05 ENCOUNTER — Other Ambulatory Visit: Payer: Self-pay

## 2018-08-05 ENCOUNTER — Ambulatory Visit: Payer: BC Managed Care – PPO | Admitting: Family Medicine

## 2018-08-05 ENCOUNTER — Encounter: Payer: Self-pay | Admitting: Family Medicine

## 2018-08-05 VITALS — BP 128/64 | HR 68 | Temp 98.5°F | Resp 14 | Ht 69.0 in | Wt 178.0 lb

## 2018-08-05 DIAGNOSIS — F900 Attention-deficit hyperactivity disorder, predominantly inattentive type: Secondary | ICD-10-CM | POA: Diagnosis not present

## 2018-08-05 DIAGNOSIS — K5792 Diverticulitis of intestine, part unspecified, without perforation or abscess without bleeding: Secondary | ICD-10-CM | POA: Diagnosis not present

## 2018-08-05 DIAGNOSIS — M8538 Osteitis condensans, other site: Secondary | ICD-10-CM | POA: Diagnosis not present

## 2018-08-05 MED ORDER — OXYCODONE-ACETAMINOPHEN 5-325 MG PO TABS: 1.0000 | ORAL_TABLET | Freq: Four times a day (QID) | ORAL | 0 refills | Status: DC | PRN

## 2018-08-05 MED ORDER — CIPROFLOXACIN HCL 500 MG PO TABS: ORAL_TABLET | ORAL | 0 refills | Status: DC

## 2018-08-05 MED ORDER — METRONIDAZOLE 500 MG PO TABS: 500.0000 mg | ORAL_TABLET | Freq: Three times a day (TID) | ORAL | 0 refills | Status: DC

## 2018-08-05 NOTE — Progress Notes (Signed)
   Subjective:    Patient ID: Maurice Sims, male    DOB: 1970-09-27, 48 y.o.   MRN: [id number]  Patient presents for Follow-up (is not fasting- states that he is much improved)  Pt here to f/u chronic medical problems. Had surgery on back    stop taking diclofenac   He is currently in Physical therapy which has helped.  Still with chiropracter Was on oxycodone, the hydrocodone and codiene makes him a more agitate    Pt had telehealth visit for GI pain. Felt pressure in his stomach for weeks. Then it worsened with extreme pain, had fever for 4 days up to 102F. Had multiple episodes of diarrhea. Has history of diverticulitis  Started Cipro and Flagyl last Friday and feels better He does remember has been eating a lot of nuts  Took few days of bentyl and zofran   COVID 19 negative     ADD- taking adderall as prescribed, does not take if not needed on weekends     Review Of Systems:  GEN- denies fatigue, fever, weight loss,weakness, recent illness HEENT- denies eye drainage, change in vision, nasal discharge, CVS- denies chest pain, palpitations RESP- denies SOB, cough, wheeze ABD- denies N/V, +change in stools,+ abd pain GU- denies dysuria, hematuria, dribbling, incontinence MSK- + joint pain, muscle aches, injury Neuro- denies headache, dizziness, syncope, seizure activity       Objective:    BP 128/64   Pulse 68   Temp 98.5 F (36.9 C) (Oral)   Resp 14   Ht 5\' 9"  (1.753 m)   Wt 178 lb (80.7 kg)   SpO2 98%   BMI 26.29 kg/m  GEN- NAD, alert and oriented x3 HEENT- PERRL, EOMI, non injected sclera, pink conjunctiva, MMM, oropharynx clear CVS- RRR, no murmur RESP-CTAB ABD-NABS,soft,mild ttp llq no rebound, no guarding Psych- normal affect and mood EXT- No edema Pulses- Radial 2+        Assessment & Plan:      Problem List Items Addressed This Visit      Unprioritized   ADD (attention deficit disorder)    Continue adderall      Diverticulitis - Primary    Abd pain improved, complete antibiotics extend to 10 day total, clear liquids then progress to softs, avoid nuts/seeds trigger foods, hold on imaging      Relevant Medications   ciprofloxacin (CIPRO) 500 MG tablet   metroNIDAZOLE (FLAGYL) 500 MG tablet   Other Relevant Orders   CBC with Differential/Platelet (Completed)   Comprehensive metabolic panel (Completed)   Osteitis condensans ilii    chronci back pain, s/p surgery for ruptured disc  Will refill percocet, advised to use very sparingly, addiction potential and to not take with adderall or benzo         Note: This dictation was prepared with Dragon dictation along with smaller phrase technology. Any transcriptional errors that result from this process are unintentional.

## 2018-08-05 NOTE — Patient Instructions (Signed)
F/u 4-6 Months for Physical

## 2018-08-06 ENCOUNTER — Other Ambulatory Visit: Payer: Self-pay | Admitting: *Deleted

## 2018-08-06 ENCOUNTER — Encounter: Payer: Self-pay | Admitting: Family Medicine

## 2018-08-06 DIAGNOSIS — R7989 Other specified abnormal findings of blood chemistry: Secondary | ICD-10-CM

## 2018-08-06 LAB — COMPREHENSIVE METABOLIC PANEL
AG Ratio: 1.8 (calc) (ref 1.0–2.5)
ALT: 53 U/L — ABNORMAL HIGH (ref 9–46)
AST: 48 U/L — ABNORMAL HIGH (ref 10–40)
Albumin: 4.5 g/dL (ref 3.6–5.1)
Alkaline phosphatase (APISO): 52 U/L (ref 36–130)
BUN: 20 mg/dL (ref 7–25)
CO2: 28 mmol/L (ref 20–32)
Calcium: 9.9 mg/dL (ref 8.6–10.3)
Chloride: 100 mmol/L (ref 98–110)
Creat: 1.06 mg/dL (ref 0.60–1.35)
Globulin: 2.5 g/dL (calc) (ref 1.9–3.7)
Glucose, Bld: 88 mg/dL (ref 65–99)
Potassium: 4.8 mmol/L (ref 3.5–5.3)
Sodium: 137 mmol/L (ref 135–146)
Total Bilirubin: 0.4 mg/dL (ref 0.2–1.2)
Total Protein: 7 g/dL (ref 6.1–8.1)

## 2018-08-06 LAB — CBC WITH DIFFERENTIAL/PLATELET
Absolute Monocytes: 720 cells/uL (ref 200–950)
Basophils Absolute: 83 cells/uL (ref 0–200)
Basophils Relative: 1.1 %
Eosinophils Absolute: 90 cells/uL (ref 15–500)
Eosinophils Relative: 1.2 %
HCT: 43.2 % (ref 38.5–50.0)
Hemoglobin: 15.3 g/dL (ref 13.2–17.1)
Lymphs Abs: 2595 cells/uL (ref 850–3900)
MCH: 30.2 pg (ref 27.0–33.0)
MCHC: 35.4 g/dL (ref 32.0–36.0)
MCV: 85.4 fL (ref 80.0–100.0)
MPV: 10.2 fL (ref 7.5–12.5)
Monocytes Relative: 9.6 %
Neutro Abs: 4013 cells/uL (ref 1500–7800)
Neutrophils Relative %: 53.5 %
Platelets: 298 10*3/uL (ref 140–400)
RBC: 5.06 10*6/uL (ref 4.20–5.80)
RDW: 13.2 % (ref 11.0–15.0)
Total Lymphocyte: 34.6 %
WBC: 7.5 10*3/uL (ref 3.8–10.8)

## 2018-08-06 NOTE — Assessment & Plan Note (Signed)
Continue adderall  

## 2018-08-06 NOTE — Assessment & Plan Note (Signed)
chronci back pain, s/p surgery for ruptured disc  Will refill percocet, advised to use very sparingly, addiction potential and to not take with adderall or benzo

## 2018-08-06 NOTE — Assessment & Plan Note (Signed)
Abd pain improved, complete antibiotics extend to 10 day total, clear liquids then progress to softs, avoid nuts/seeds trigger foods, hold on imaging

## 2018-08-13 ENCOUNTER — Other Ambulatory Visit: Payer: BC Managed Care – PPO

## 2018-08-13 ENCOUNTER — Other Ambulatory Visit: Payer: Self-pay

## 2018-08-13 DIAGNOSIS — R7989 Other specified abnormal findings of blood chemistry: Secondary | ICD-10-CM

## 2018-08-14 LAB — COMPLETE METABOLIC PANEL WITH GFR
AG Ratio: 1.9 (calc) (ref 1.0–2.5)
ALT: 35 U/L (ref 9–46)
AST: 27 U/L (ref 10–40)
Albumin: 4.3 g/dL (ref 3.6–5.1)
Alkaline phosphatase (APISO): 48 U/L (ref 36–130)
BUN: 20 mg/dL (ref 7–25)
CO2: 27 mmol/L (ref 20–32)
Calcium: 10 mg/dL (ref 8.6–10.3)
Chloride: 102 mmol/L (ref 98–110)
Creat: 0.99 mg/dL (ref 0.60–1.35)
GFR, Est African American: 104 mL/min/{1.73_m2} (ref >=60)
GFR, Est Non African American: 90 mL/min/{1.73_m2} (ref >=60)
Globulin: 2.3 g/dL (calc) (ref 1.9–3.7)
Glucose, Bld: 84 mg/dL (ref 65–99)
Potassium: 4.7 mmol/L (ref 3.5–5.3)
Sodium: 141 mmol/L (ref 135–146)
Total Bilirubin: 0.3 mg/dL (ref 0.2–1.2)
Total Protein: 6.6 g/dL (ref 6.1–8.1)

## 2018-08-31 ENCOUNTER — Other Ambulatory Visit: Payer: Self-pay | Admitting: *Deleted

## 2018-08-31 NOTE — Telephone Encounter (Signed)
Received call from patient.   Requested refill on Oxycodone/APAP.   Ok to refill??  Last office visit/ refill 08/05/2018.

## 2018-09-01 MED ORDER — OXYCODONE-ACETAMINOPHEN 5-325 MG PO TABS: 1.0000 | ORAL_TABLET | Freq: Four times a day (QID) | ORAL | 0 refills | Status: DC | PRN

## 2018-09-02 ENCOUNTER — Other Ambulatory Visit: Payer: Self-pay | Admitting: *Deleted

## 2018-09-02 MED ORDER — AMPHETAMINE-DEXTROAMPHETAMINE 30 MG PO TABS: 1.0000 | ORAL_TABLET | Freq: Two times a day (BID) | ORAL | 0 refills | Status: DC

## 2018-09-02 NOTE — Telephone Encounter (Signed)
Received call from patient.   Requested refill on Adderall.   Ok to refill??  Last office visit 08/05/2018.  Last refill 07/31/2018.

## 2018-09-03 ENCOUNTER — Telehealth: Payer: Self-pay | Admitting: *Deleted

## 2018-09-03 NOTE — Telephone Encounter (Signed)
Received request from pharmacy for PA on Adderall.   PA submitted.   Dx: [id number].  

## 2018-09-03 NOTE — Telephone Encounter (Signed)
[facility] is reviewing your PA request. Typically an electronic response will be received within 72 hours. To check for an update later, open this request from your dashboard. 

## 2018-09-03 NOTE — Telephone Encounter (Signed)
Received PA determination.   PA [id number] approved 09/03/2018- 09/03/2019.  Pharmacy made aware.   Call placed to patient. [place].

## 2018-09-03 NOTE — Telephone Encounter (Signed)
Patient returned call and made aware.

## 2018-10-01 ENCOUNTER — Other Ambulatory Visit: Payer: Self-pay | Admitting: *Deleted

## 2018-10-01 NOTE — Telephone Encounter (Signed)
Received call from patient.   Requested refill on Oxycodone/APAP.  Ok to refill??  Last office visit 08/05/2018.  Last refill 09/01/2018.

## 2018-10-02 MED ORDER — OXYCODONE-ACETAMINOPHEN 5-325 MG PO TABS: 1.0000 | ORAL_TABLET | Freq: Four times a day (QID) | ORAL | 0 refills | Status: DC | PRN

## 2018-10-08 ENCOUNTER — Other Ambulatory Visit: Payer: Self-pay | Admitting: *Deleted

## 2018-10-08 NOTE — Telephone Encounter (Signed)
Received call from patient.   Requested refill on Adderall.   Ok to refill??  Last office visit 08/05/2018.  Last refill 09/02/2018.

## 2018-10-09 MED ORDER — AMPHETAMINE-DEXTROAMPHETAMINE 30 MG PO TABS: 1.0000 | ORAL_TABLET | Freq: Two times a day (BID) | ORAL | 0 refills | Status: DC

## 2018-10-23 ENCOUNTER — Other Ambulatory Visit: Payer: Self-pay | Admitting: Family Medicine

## 2018-10-30 ENCOUNTER — Encounter: Payer: Self-pay | Admitting: Family Medicine

## 2018-10-30 MED ORDER — AMPHETAMINE-DEXTROAMPHETAMINE 30 MG PO TABS: 1.0000 | ORAL_TABLET | Freq: Two times a day (BID) | ORAL | 0 refills | Status: DC

## 2018-10-30 MED ORDER — OXYCODONE-ACETAMINOPHEN 5-325 MG PO TABS: 1.0000 | ORAL_TABLET | Freq: Four times a day (QID) | ORAL | 0 refills | Status: DC | PRN

## 2018-10-30 NOTE — Telephone Encounter (Signed)
Ok to refill??  Last office visit 08/05/2018.  Last refill on Oxycodone/APAP 10/02/2018.  Last refill on Adderall 10/09/2018.

## 2018-11-26 ENCOUNTER — Other Ambulatory Visit: Payer: Self-pay | Admitting: Family Medicine

## 2018-11-27 MED ORDER — OXYCODONE-ACETAMINOPHEN 5-325 MG PO TABS: 1.0000 | ORAL_TABLET | Freq: Four times a day (QID) | ORAL | 0 refills | Status: DC | PRN

## 2018-11-27 NOTE — Telephone Encounter (Signed)
Ok to refill??  Last office visit 08/05/2018.  Last refill 10/30/2018.

## 2018-12-07 ENCOUNTER — Other Ambulatory Visit: Payer: Self-pay | Admitting: Family Medicine

## 2018-12-07 MED ORDER — AMPHETAMINE-DEXTROAMPHETAMINE 30 MG PO TABS: 1.0000 | ORAL_TABLET | Freq: Two times a day (BID) | ORAL | 0 refills | Status: DC

## 2018-12-07 NOTE — Telephone Encounter (Signed)
Ok to refill??  Last office visit 08/05/2018.  Last refill 10/30/2018. 

## 2018-12-21 ENCOUNTER — Other Ambulatory Visit: Payer: Self-pay | Admitting: Family Medicine

## 2018-12-21 MED ORDER — OXYCODONE-ACETAMINOPHEN 5-325 MG PO TABS: 1.0000 | ORAL_TABLET | Freq: Four times a day (QID) | ORAL | 0 refills | Status: DC | PRN

## 2018-12-21 NOTE — Telephone Encounter (Signed)
Ok to refill??  Last office visit 08/05/2018.  Last refill 11/27/2018.

## 2019-01-06 ENCOUNTER — Other Ambulatory Visit: Payer: Self-pay | Admitting: Family Medicine

## 2019-01-06 MED ORDER — AMPHETAMINE-DEXTROAMPHETAMINE 30 MG PO TABS: 1.0000 | ORAL_TABLET | Freq: Two times a day (BID) | ORAL | 0 refills | Status: DC

## 2019-01-06 NOTE — Telephone Encounter (Signed)
Ok to refill??  Last office visit 08/05/2018.  Last refill 12/07/2018.

## 2019-01-11 ENCOUNTER — Other Ambulatory Visit: Payer: Self-pay | Admitting: Family Medicine

## 2019-01-11 NOTE — Telephone Encounter (Signed)
Ok to refill??  Last office visit 08/05/2018.  Last refill 12/21/2018.

## 2019-01-11 NOTE — Telephone Encounter (Signed)
Prescription sent to pharmacy on 01/06/2019.  Please refuse as duplicate request.

## 2019-01-12 MED ORDER — OXYCODONE-ACETAMINOPHEN 5-325 MG PO TABS: 1.0000 | ORAL_TABLET | Freq: Four times a day (QID) | ORAL | 0 refills | Status: DC | PRN

## 2019-02-01 ENCOUNTER — Other Ambulatory Visit: Payer: Self-pay | Admitting: Family Medicine

## 2019-02-01 NOTE — Telephone Encounter (Signed)
Call pt, advise, see if he can make meds last 30 days, If having increasing back pain, may need to be re-evaluated by his surgeon

## 2019-02-01 NOTE — Telephone Encounter (Signed)
Ok to refill??  Last office visit 08/05/2018.  Last refill 01/12/2019.

## 2019-02-02 MED ORDER — OXYCODONE-ACETAMINOPHEN 5-325 MG PO TABS: 1.0000 | ORAL_TABLET | Freq: Four times a day (QID) | ORAL | 0 refills | Status: DC | PRN

## 2019-02-02 NOTE — Telephone Encounter (Signed)
Noted, will give 1 early refill

## 2019-02-02 NOTE — Telephone Encounter (Signed)
Call placed to patient to discuss medication.  Reports that he has been taking a few more than normal as he is having increased muscle pain due to COVID.

## 2019-02-08 ENCOUNTER — Other Ambulatory Visit: Payer: Self-pay | Admitting: Family Medicine

## 2019-02-08 MED ORDER — AMPHETAMINE-DEXTROAMPHETAMINE 30 MG PO TABS: 1.0000 | ORAL_TABLET | Freq: Two times a day (BID) | ORAL | 0 refills | Status: DC

## 2019-02-08 NOTE — Telephone Encounter (Signed)
Last office visit: 08/05/2018 Last refilled: 01/06/2019

## 2019-02-17 ENCOUNTER — Other Ambulatory Visit: Payer: Self-pay | Admitting: Family Medicine

## 2019-02-17 NOTE — Telephone Encounter (Signed)
Ok to refill??  Last office visit 08/05/2018.  Last refill 07/31/2018, #1 refill.

## 2019-03-01 ENCOUNTER — Other Ambulatory Visit: Payer: Self-pay | Admitting: Family Medicine

## 2019-03-03 ENCOUNTER — Other Ambulatory Visit: Payer: Self-pay | Admitting: Family Medicine

## 2019-03-03 MED ORDER — OXYCODONE-ACETAMINOPHEN 5-325 MG PO TABS: 1.0000 | ORAL_TABLET | Freq: Four times a day (QID) | ORAL | 0 refills | Status: DC | PRN

## 2019-03-03 NOTE — Telephone Encounter (Signed)
Ok to refill??  Last office visit 08/05/2018.  Last refill 02/02/2019.

## 2019-03-11 ENCOUNTER — Other Ambulatory Visit: Payer: Self-pay | Admitting: Family Medicine

## 2019-03-11 NOTE — Telephone Encounter (Signed)
Ok to refill??  Last office visit 08/05/2018.  Last refill 02/08/2019.

## 2019-03-12 MED ORDER — AMPHETAMINE-DEXTROAMPHETAMINE 30 MG PO TABS: 1.0000 | ORAL_TABLET | Freq: Two times a day (BID) | ORAL | 0 refills | Status: DC

## 2019-03-12 NOTE — Telephone Encounter (Signed)
Ok to refill??  Last office visit 08/05/2018.  Last refill 02/08/2019. 

## 2019-04-01 ENCOUNTER — Other Ambulatory Visit: Payer: Self-pay | Admitting: Family Medicine

## 2019-04-02 MED ORDER — OXYCODONE-ACETAMINOPHEN 5-325 MG PO TABS: 1.0000 | ORAL_TABLET | Freq: Four times a day (QID) | ORAL | 0 refills | Status: DC | PRN

## 2019-04-02 NOTE — Telephone Encounter (Signed)
Ok to refill??  Last office visit 08/05/2018.  Last refill 03/03/2019.

## 2019-04-08 ENCOUNTER — Other Ambulatory Visit: Payer: Self-pay | Admitting: Family Medicine

## 2019-04-09 MED ORDER — AMPHETAMINE-DEXTROAMPHETAMINE 30 MG PO TABS: 1.0000 | ORAL_TABLET | Freq: Two times a day (BID) | ORAL | 0 refills | Status: DC

## 2019-04-09 NOTE — Telephone Encounter (Signed)
Ok to refill??  Last office visit 08/05/2018.  Last refill 03/12/2019.

## 2019-04-13 ENCOUNTER — Encounter: Payer: BC Managed Care – PPO | Admitting: Family Medicine

## 2019-04-20 ENCOUNTER — Ambulatory Visit (INDEPENDENT_AMBULATORY_CARE_PROVIDER_SITE_OTHER): Payer: 59 | Admitting: Family Medicine

## 2019-04-20 ENCOUNTER — Encounter: Payer: Self-pay | Admitting: Family Medicine

## 2019-04-20 ENCOUNTER — Other Ambulatory Visit: Payer: Self-pay

## 2019-04-20 VITALS — BP 118/68 | HR 82 | Temp 98.1°F | Resp 14 | Ht 69.0 in | Wt 194.0 lb

## 2019-04-20 DIAGNOSIS — F900 Attention-deficit hyperactivity disorder, predominantly inattentive type: Secondary | ICD-10-CM

## 2019-04-20 DIAGNOSIS — F411 Generalized anxiety disorder: Secondary | ICD-10-CM | POA: Diagnosis not present

## 2019-04-20 DIAGNOSIS — Z0001 Encounter for general adult medical examination with abnormal findings: Secondary | ICD-10-CM

## 2019-04-20 DIAGNOSIS — Z0000 Encounter for general adult medical examination without abnormal findings: Secondary | ICD-10-CM

## 2019-04-20 DIAGNOSIS — Z23 Encounter for immunization: Secondary | ICD-10-CM | POA: Diagnosis not present

## 2019-04-20 DIAGNOSIS — M8538 Osteitis condensans, other site: Secondary | ICD-10-CM | POA: Diagnosis not present

## 2019-04-20 DIAGNOSIS — F5104 Psychophysiologic insomnia: Secondary | ICD-10-CM

## 2019-04-20 NOTE — Assessment & Plan Note (Signed)
Continue on pain medicine as prescribed as well as anti-inflammatory.

## 2019-04-20 NOTE — Patient Instructions (Signed)
F/U 6 months  We will call with lab results  

## 2019-04-20 NOTE — Progress Notes (Signed)
   Subjective:    Patient ID: Maurice Sims, male    DOB: Feb 15, 1970, 49 y.o.   MRN: [id number]  Patient presents for Annual Exam (is not fasting)  Patient here for CPE.  Medications reviewed history reviewed.  He did have COVID-19 infection a couple months ago his symptoms have completely resolved.  ADHD has been maintained on Adderall 30 mg once a day without any side effects.  He does not take it on the weekends if he does not require for work.  Generalized anxiety disorder/chronic insomnia  he uses clonazepam as needed  Chronic pain status post back surgery he has oxycodone acetaminophen as needed does not take with his benzo.  He also has anti-inflammatory diclofenac as needed he does have chronic pain normal as daily aching in his back but is able to do most of his activities.  When his back does flares when he uses his pain medications.  Due for TDAP   Taking MVI, B6, D3, Zinc  Vasectomy reversal last Fall    Dentist UTD    Eye doctor- has prescription glasses   Review Of Systems:  GEN- denies fatigue, fever, weight loss,weakness, recent illness HEENT- denies eye drainage, change in vision, nasal discharge, CVS- denies chest pain, palpitations RESP- denies SOB, cough, wheeze ABD- denies N/V, change in stools, abd pain GU- denies dysuria, hematuria, dribbling, incontinence MSK- denies joint pain, muscle aches, injury Neuro- denies headache, dizziness, syncope, seizure activity       Objective:    BP 118/68   Pulse 82   Temp 98.1 F (36.7 C) (Temporal)   Resp 14   Ht 5\' 9"  (1.753 m)   Wt 194 lb (88 kg)   SpO2 97%   BMI 28.65 kg/m  GEN- NAD, alert and oriented x3 HEENT- PERRL, EOMI, non injected sclera, pink conjunctiva, MMM, oropharynx clear, cold sore right upper lip Neck- Supple, no thyromegaly CVS- RRR, no murmur RESP-CTAB ABD-NABS,soft,NT,ND EXT- No edema Pulses- Radial, DP- 2+  Fall/CAGE/depression screen negative      Assessment &  Plan:      Problem List Items Addressed This Visit      Unprioritized   ADD (attention deficit disorder)    Adderall 30 mg once a day.      Chronic back pain    Continue on pain medicine as prescribed as well as anti-inflammatory.      Chronic insomnia    As needed clonazepam for his anxiety and sleep      GAD (generalized anxiety disorder)    Other Visit Diagnoses    Routine general medical examination at a health care facility    -  Primary   CPE done.  Tdap given.  He is using Abreva for the cold sore.  Fasting labs to be obtained   Relevant Orders   CBC with Differential/Platelet   Comprehensive metabolic panel   Lipid panel   Tdap vaccine greater than or equal to 7yo IM (Completed)   Need for tetanus, diphtheria, and acellular pertussis (Tdap) vaccine in patient of adolescent age or older       Relevant Orders   Tdap vaccine greater than or equal to 7yo IM (Completed)      Note: This dictation was prepared with Dragon dictation along with smaller phrase technology. Any transcriptional errors that result from this process are unintentional.

## 2019-04-20 NOTE — Assessment & Plan Note (Signed)
As needed clonazepam for his anxiety and sleep

## 2019-04-20 NOTE — Assessment & Plan Note (Signed)
Adderall 30 mg once a day.

## 2019-04-21 LAB — CBC WITH DIFFERENTIAL/PLATELET
Absolute Monocytes: 653 cells/uL (ref 200–950)
Basophils Absolute: 38 cells/uL (ref 0–200)
Basophils Relative: 0.6 %
Eosinophils Absolute: 90 cells/uL (ref 15–500)
Eosinophils Relative: 1.4 %
HCT: 43.5 % (ref 38.5–50.0)
Hemoglobin: 14.5 g/dL (ref 13.2–17.1)
Lymphs Abs: 2208 cells/uL (ref 850–3900)
MCH: 29.9 pg (ref 27.0–33.0)
MCHC: 33.3 g/dL (ref 32.0–36.0)
MCV: 89.7 fL (ref 80.0–100.0)
MPV: 10.2 fL (ref 7.5–12.5)
Monocytes Relative: 10.2 %
Neutro Abs: 3411 cells/uL (ref 1500–7800)
Neutrophils Relative %: 53.3 %
Platelets: 295 10*3/uL (ref 140–400)
RBC: 4.85 10*6/uL (ref 4.20–5.80)
RDW: 13.1 % (ref 11.0–15.0)
Total Lymphocyte: 34.5 %
WBC: 6.4 10*3/uL (ref 3.8–10.8)

## 2019-04-21 LAB — COMPREHENSIVE METABOLIC PANEL
AG Ratio: 2 (calc) (ref 1.0–2.5)
ALT: 32 U/L (ref 9–46)
AST: 30 U/L (ref 10–40)
Albumin: 4.2 g/dL (ref 3.6–5.1)
Alkaline phosphatase (APISO): 47 U/L (ref 36–130)
BUN: 17 mg/dL (ref 7–25)
CO2: 30 mmol/L (ref 20–32)
Calcium: 9.5 mg/dL (ref 8.6–10.3)
Chloride: 105 mmol/L (ref 98–110)
Creat: 1.17 mg/dL (ref 0.60–1.35)
Globulin: 2.1 g/dL (calc) (ref 1.9–3.7)
Glucose, Bld: 85 mg/dL (ref 65–99)
Potassium: 4.3 mmol/L (ref 3.5–5.3)
Sodium: 141 mmol/L (ref 135–146)
Total Bilirubin: 0.4 mg/dL (ref 0.2–1.2)
Total Protein: 6.3 g/dL (ref 6.1–8.1)

## 2019-04-21 LAB — LIPID PANEL
Cholesterol: 182 mg/dL (ref <200)
HDL: 32 mg/dL — ABNORMAL LOW (ref >=40)
LDL Cholesterol (Calc): 129 mg/dL (calc) — ABNORMAL HIGH
Non-HDL Cholesterol (Calc): 150 mg/dL (calc) — ABNORMAL HIGH (ref <130)
Total CHOL/HDL Ratio: 5.7 (calc) — ABNORMAL HIGH (ref <5.0)
Triglycerides: 99 mg/dL (ref <150)

## 2019-05-03 ENCOUNTER — Other Ambulatory Visit: Payer: Self-pay | Admitting: Family Medicine

## 2019-05-03 MED ORDER — OXYCODONE-ACETAMINOPHEN 5-325 MG PO TABS: 1.0000 | ORAL_TABLET | Freq: Four times a day (QID) | ORAL | 0 refills | Status: DC | PRN

## 2019-05-03 NOTE — Telephone Encounter (Signed)
Requested Prescriptions   Pending Prescriptions Disp Refills  . oxyCODONE-acetaminophen (PERCOCET) 5-325 MG tablet 30 tablet 0    Sig: Take 1 tablet by mouth every 6 (six) hours as needed for severe pain.     Last OV 04/20/2019   Last written 04/02/2019

## 2019-05-10 ENCOUNTER — Other Ambulatory Visit: Payer: Self-pay | Admitting: Family Medicine

## 2019-05-10 MED ORDER — AMPHETAMINE-DEXTROAMPHETAMINE 30 MG PO TABS: 1.0000 | ORAL_TABLET | Freq: Two times a day (BID) | ORAL | 0 refills | Status: DC

## 2019-05-10 NOTE — Telephone Encounter (Signed)
Ok to refill??  Last office visit 04/20/2019.  Last refill 03/12/2019.

## 2019-05-21 ENCOUNTER — Ambulatory Visit: Payer: 59 | Admitting: Family Medicine

## 2019-05-21 ENCOUNTER — Other Ambulatory Visit: Payer: Self-pay

## 2019-05-21 ENCOUNTER — Ambulatory Visit
Admission: RE | Admit: 2019-05-21 | Discharge: 2019-05-21 | Disposition: A | Discharge status: Home / Self Care | Payer: 59 | Source: Ambulatory Visit | Attending: Family Medicine | Admitting: Family Medicine

## 2019-05-21 ENCOUNTER — Encounter: Payer: Self-pay | Admitting: Family Medicine

## 2019-05-21 VITALS — BP 140/76 | HR 90 | Temp 97.7°F | Resp 18 | Ht 69.0 in | Wt 196.0 lb

## 2019-05-21 DIAGNOSIS — M898X6 Other specified disorders of bone, lower leg: Secondary | ICD-10-CM

## 2019-05-21 MED ORDER — CEPHALEXIN 500 MG PO CAPS
500.0000 mg | ORAL_CAPSULE | Freq: Three times a day (TID) | ORAL | 0 refills | Status: DC
Start: 2019-05-21 — End: 2019-10-20

## 2019-05-21 NOTE — Progress Notes (Signed)
Subjective:    Patient ID: Maurice Sims, male    DOB: 11-02-1970, 49 y.o.   MRN: [id number]  HPI  1 week ago, the patient fell from a 12 foot ladder striking his anterior left shin on a metal table.  He suffered a large vertical gash on his anterior left shin.  That gas today is approximately 3 inches x 1/4 inch wide.  There is subcutaneous fascia showing.  There is no surrounding erythema or purulent drainage however the skin around that is tender and swollen.  The patient was able to bear weight immediately however he had significant pain in his shin the following day.  What has the patient concerned is now 6 days later the pain over his anterior left shin has worsened dramatically.  It is now exquisitely tender to palpation along the anterior left shin.  It hurts to bear weight.  It hurts to simply touch the skin.  There is no visible erythema.  There is no significant swelling.  There is no evidence of superficial thrombophlebitis.  There is no warmth to the skin.  However pain is out of proportion to physical exam findings raising the concern about a possible infection in the subcutaneous tissue or a hairline fracture in the tibia. Past Medical History:  Diagnosis Date  . Anxiety   . Diverticulitis    Past Surgical History:  Procedure Laterality Date  . BACK SURGERY    . ELBOW SURGERY    . HERNIA REPAIR     Current Outpatient Medications on File Prior to Visit  Medication Sig Dispense Refill  . amphetamine-dextroamphetamine (ADDERALL) 30 MG tablet Take 1 tablet by mouth 2 (two) times daily. 60 tablet 0  . b complex vitamins tablet Take 1 tablet by mouth daily.    . clonazePAM (KLONOPIN) 1 MG tablet TAKE 1 TABLET BY MOUTH TWICE DAILY AS NEEDED FOR ANXIETY 60 tablet 1  . diclofenac (VOLTAREN) 75 MG EC tablet TAKE 1 TABLET BY MOUTH TWICE DAILY 60 tablet 3  . Multiple Vitamin (MULITIVITAMIN WITH MINERALS) TABS Take 1 tablet by mouth daily.    Marland Kitchen oxyCODONE-acetaminophen (PERCOCET)  5-325 MG tablet Take 1 tablet by mouth every 6 (six) hours as needed for severe pain. 30 tablet 0   No current facility-administered medications on file prior to visit.   No Known Allergies Social History   Socioeconomic History  . Marital status: Married    Spouse name: Not on file  . Number of children: Not on file  . Years of education: Not on file  . Highest education level: Not on file  Occupational History  . Not on file  Tobacco Use  . Smoking status: Former Smoker    Quit date: 01/15/2007    Years since quitting: 12.3  . Smokeless tobacco: Never Used  Substance and Sexual Activity  . Alcohol use: Yes    Comment: weekends  . Drug use: No  . Sexual activity: Yes  Other Topics Concern  . Not on file  Social History Narrative  . Not on file   Social Determinants of Health   Financial Resource Strain:   . Difficulty of Paying Living Expenses:   Food Insecurity:   . Worried About Programme researcher, broadcasting/film/video in the Last Year:   . Barista in the Last Year:   Transportation Needs:   . Freight forwarder (Medical):   Marland Kitchen Lack of Transportation (Non-Medical):   Physical Activity:   . Days of Exercise  per Week:   . Minutes of Exercise per Session:   Stress:   . Feeling of Stress :   Social Connections:   . Frequency of Communication with Friends and Family:   . Frequency of Social Gatherings with Friends and Family:   . Attends Religious Services:   . Active Member of Clubs or Organizations:   . Attends Archivist Meetings:   Marland Kitchen Marital Status:   Intimate Partner Violence:   . Fear of Current or Ex-Partner:   . Emotionally Abused:   Marland Kitchen Physically Abused:   . Sexually Abused:      Review of Systems  All other systems reviewed and are negative.      Objective:   Physical Exam Vitals reviewed.  Constitutional:      Appearance: Normal appearance. He is not ill-appearing.  Cardiovascular:     Rate and Rhythm: Normal rate and regular rhythm.      Heart sounds: Normal heart sounds.  Pulmonary:     Effort: Pulmonary effort is normal.     Breath sounds: Normal breath sounds.  Musculoskeletal:     Left lower leg: Laceration, tenderness and bony tenderness present. No swelling. No edema.       Legs:  Neurological:     Mental Status: He is alert.           Assessment & Plan:  Pain in left tibia - Plan: DG Tibia/Fibula Left  Pain is worsening 6 days into the injury.  This raises the concern about possible hairline fracture in the tibia as well as possible infection of the subcutaneous space.  I will start the patient on Keflex 500 mg p.o. 3 times daily for possible infection.  I will obtain an x-ray of the tibia to rule out fracture in the tibia.  Patient is able to bear weight although with pain.  Hopefully x-ray to rule out fracture and symptoms will improve over the weekend with rest.

## 2019-06-02 ENCOUNTER — Other Ambulatory Visit: Payer: Self-pay | Admitting: Family Medicine

## 2019-06-02 MED ORDER — OXYCODONE-ACETAMINOPHEN 5-325 MG PO TABS: 1.0000 | ORAL_TABLET | Freq: Four times a day (QID) | ORAL | 0 refills | Status: DC | PRN

## 2019-06-02 NOTE — Telephone Encounter (Signed)
Ok to refill??  Last office visit 05/21/2019.  Last refill 05/03/2019.

## 2019-06-09 ENCOUNTER — Other Ambulatory Visit: Payer: Self-pay | Admitting: Family Medicine

## 2019-06-09 MED ORDER — AMPHETAMINE-DEXTROAMPHETAMINE 30 MG PO TABS: 1.0000 | ORAL_TABLET | Freq: Two times a day (BID) | ORAL | 0 refills | Status: DC

## 2019-06-09 NOTE — Telephone Encounter (Signed)
Ok to refill??  Last office visit 04/20/2019.  Last refill 05/10/2019.

## 2019-06-17 ENCOUNTER — Other Ambulatory Visit: Payer: Self-pay | Admitting: Family Medicine

## 2019-06-18 NOTE — Telephone Encounter (Signed)
Ok to refill??  Last office visit 04/20/2019.  Last refill 02/17/2019, #1 refills.

## 2019-07-02 ENCOUNTER — Other Ambulatory Visit: Payer: Self-pay | Admitting: Family Medicine

## 2019-07-02 MED ORDER — OXYCODONE-ACETAMINOPHEN 5-325 MG PO TABS: 1.0000 | ORAL_TABLET | Freq: Four times a day (QID) | ORAL | 0 refills | Status: DC | PRN

## 2019-07-02 NOTE — Telephone Encounter (Signed)
Last refilled: 06/02/2019 Last office visit: 05/21/2019

## 2019-07-13 ENCOUNTER — Other Ambulatory Visit: Payer: Self-pay | Admitting: Family Medicine

## 2019-07-13 MED ORDER — AMPHETAMINE-DEXTROAMPHETAMINE 30 MG PO TABS: 1.0000 | ORAL_TABLET | Freq: Two times a day (BID) | ORAL | 0 refills | Status: DC

## 2019-07-17 ENCOUNTER — Other Ambulatory Visit: Payer: Self-pay | Admitting: Family Medicine

## 2019-08-02 ENCOUNTER — Other Ambulatory Visit: Payer: Self-pay | Admitting: Family Medicine

## 2019-08-02 MED ORDER — OXYCODONE-ACETAMINOPHEN 5-325 MG PO TABS: 1.0000 | ORAL_TABLET | Freq: Four times a day (QID) | ORAL | 0 refills | Status: DC | PRN

## 2019-08-02 NOTE — Telephone Encounter (Signed)
Ok to refill??  Last office visit 04/20/2019.  Last refill 07/02/2019.

## 2019-08-12 ENCOUNTER — Other Ambulatory Visit: Payer: Self-pay | Admitting: Family Medicine

## 2019-08-12 NOTE — Telephone Encounter (Signed)
Ok to refill??  Last office visit 04/20/2019.  Last refill 07/13/2019. 

## 2019-08-13 MED ORDER — AMPHETAMINE-DEXTROAMPHETAMINE 30 MG PO TABS: 1.0000 | ORAL_TABLET | Freq: Two times a day (BID) | ORAL | 0 refills | Status: DC

## 2019-08-13 NOTE — Telephone Encounter (Signed)
Ok to refill??  Last office visit 04/20/2019.  Last refill 07/13/2019.

## 2019-09-02 ENCOUNTER — Other Ambulatory Visit: Payer: Self-pay | Admitting: Family Medicine

## 2019-09-02 NOTE — Telephone Encounter (Signed)
Ok to refill??  Last office visit 05/21/2019.  Last refill 08/02/2019.

## 2019-09-03 MED ORDER — OXYCODONE-ACETAMINOPHEN 5-325 MG PO TABS: 1.0000 | ORAL_TABLET | Freq: Four times a day (QID) | ORAL | 0 refills | Status: DC | PRN

## 2019-09-13 ENCOUNTER — Other Ambulatory Visit: Payer: Self-pay | Admitting: Family Medicine

## 2019-09-13 MED ORDER — AMPHETAMINE-DEXTROAMPHETAMINE 30 MG PO TABS: 1.0000 | ORAL_TABLET | Freq: Two times a day (BID) | ORAL | 0 refills | Status: DC

## 2019-09-13 NOTE — Telephone Encounter (Signed)
Ok to refill??  Last office visit 04/20/2019.  Last refill 08/13/2019.

## 2019-10-01 ENCOUNTER — Other Ambulatory Visit: Payer: Self-pay | Admitting: Family Medicine

## 2019-10-01 MED ORDER — OXYCODONE-ACETAMINOPHEN 5-325 MG PO TABS: 1.0000 | ORAL_TABLET | Freq: Four times a day (QID) | ORAL | 0 refills | Status: DC | PRN

## 2019-10-01 NOTE — Telephone Encounter (Signed)
Ok to refill??  Last office visit 04/20/2019.  Last refill 09/03/2019.

## 2019-10-13 ENCOUNTER — Other Ambulatory Visit: Payer: Self-pay | Admitting: Family Medicine

## 2019-10-13 NOTE — Telephone Encounter (Signed)
Ok to refill??  Last office visit 04/20/2019.  Last refill 10/01/2019.

## 2019-10-15 ENCOUNTER — Other Ambulatory Visit: Payer: Self-pay | Admitting: Family Medicine

## 2019-10-15 MED ORDER — AMPHETAMINE-DEXTROAMPHETAMINE 30 MG PO TABS: 1.0000 | ORAL_TABLET | Freq: Two times a day (BID) | ORAL | 0 refills | Status: DC

## 2019-10-15 NOTE — Telephone Encounter (Signed)
Ok to refill??  Last office visit 04/20/2019.  Last refill 09/13/2019.

## 2019-10-20 ENCOUNTER — Encounter: Payer: Self-pay | Admitting: Family Medicine

## 2019-10-20 ENCOUNTER — Other Ambulatory Visit: Payer: Self-pay

## 2019-10-20 ENCOUNTER — Ambulatory Visit: Payer: 59 | Admitting: Family Medicine

## 2019-10-20 VITALS — BP 132/68 | HR 78 | Temp 97.9°F | Resp 14 | Ht 69.0 in | Wt 185.0 lb

## 2019-10-20 DIAGNOSIS — F411 Generalized anxiety disorder: Secondary | ICD-10-CM

## 2019-10-20 DIAGNOSIS — F900 Attention-deficit hyperactivity disorder, predominantly inattentive type: Secondary | ICD-10-CM

## 2019-10-20 DIAGNOSIS — Z125 Encounter for screening for malignant neoplasm of prostate: Secondary | ICD-10-CM

## 2019-10-20 DIAGNOSIS — F5104 Psychophysiologic insomnia: Secondary | ICD-10-CM | POA: Diagnosis not present

## 2019-10-20 DIAGNOSIS — M8538 Osteitis condensans, other site: Secondary | ICD-10-CM

## 2019-10-20 DIAGNOSIS — R6882 Decreased libido: Secondary | ICD-10-CM

## 2019-10-20 LAB — PSA: PSA: 0.54 ng/mL (ref <=4.0)

## 2019-10-20 NOTE — Progress Notes (Signed)
   Subjective:    Patient ID: Maurice Sims, male    DOB: 05-Apr-1970, 49 y.o.   MRN: [id number]  Patient presents for Follow-up (is not fasting)  Pt here to f/u medications    ADD- he is still taking Adderall without any difficulty.  Back pain.  He still uses hydrocodone as needed for flares.  He is still in physical therapy as needed as well.  Anxiety feels the symptoms are controlled with the clonazepam he is sleeping okay.  He is concerned about decreased libido and his testosterone level.  His testosterone was in the 300s a few years ago.  He did have a vasectomy which was reversed in September of last year.  A normal sperm check back in March but he has wife are yet to become pregnant.  He was followed by [facility] - Dr. Clarisa Schools       Review Of Systems:  GEN- denies fatigue, fever, weight loss,weakness, recent illness HEENT- denies eye drainage, change in vision, nasal discharge, CVS- denies chest pain, palpitations RESP- denies SOB, cough, wheeze ABD- denies N/V, change in stools, abd pain GU- denies dysuria, hematuria, dribbling, incontinence MSK- denies joint pain, muscle aches, injury Neuro- denies headache, dizziness, syncope, seizure activity       Objective:    BP 132/68   Pulse 78   Temp 97.9 F (36.6 C) (Temporal)   Resp 14   Ht 5\' 9"  (1.753 m)   Wt 185 lb (83.9 kg)   SpO2 96%   BMI 27.32 kg/m  GEN- NAD, alert and oriented x3 HEENT- PERRL, EOMI, non injected sclera, pink conjunctiva, MMM, oropharynx clear Neck- Supple, no thyromegaly CVS- RRR, no murmur RESP-CTAB ABD-NABS,soft,NT,ND Psych normal affect and mood  EXT- No edema Pulses- Radial, DP- 2+  PHQ 9 score is 0       Assessment & Plan:      Problem List Items Addressed This Visit      Unprioritized   ADD (attention deficit disorder)    Continue adderall      Chronic back pain - Primary    Pain meds refilled, use prn       Chronic insomnia   GAD (generalized  anxiety disorder)    Controlled with klonopin prn       Other Visit Diagnoses    Decreased libido       check testosterone if low, referral to urology to discuss treatment and pregnancy concerns, check PSA as well    Relevant Orders   Comprehensive metabolic panel   CBC with Differential/Platelet   TSH   Testosterone Total,Free,Bio, Males   Prostate cancer screening       Relevant Orders   PSA      Note: This dictation was prepared with Dragon dictation along with smaller phrase technology. Any transcriptional errors that result from this process are unintentional.

## 2019-10-20 NOTE — Assessment & Plan Note (Signed)
Controlled with klonopin prn

## 2019-10-20 NOTE — Assessment & Plan Note (Signed)
Pain meds refilled, use prn

## 2019-10-20 NOTE — Assessment & Plan Note (Signed)
Continue adderall  

## 2019-10-20 NOTE — Patient Instructions (Signed)
F/U 74months for Physical  We will call or mychart lab results

## 2019-10-21 LAB — COMPREHENSIVE METABOLIC PANEL
AG Ratio: 1.9 (calc) (ref 1.0–2.5)
ALT: 37 U/L (ref 9–46)
AST: 28 U/L (ref 10–40)
Albumin: 4.2 g/dL (ref 3.6–5.1)
Alkaline phosphatase (APISO): 45 U/L (ref 36–130)
BUN: 20 mg/dL (ref 7–25)
CO2: 28 mmol/L (ref 20–32)
Calcium: 9.5 mg/dL (ref 8.6–10.3)
Chloride: 105 mmol/L (ref 98–110)
Creat: 1.26 mg/dL (ref 0.60–1.35)
Globulin: 2.2 g/dL (calc) (ref 1.9–3.7)
Glucose, Bld: 51 mg/dL — ABNORMAL LOW (ref 65–99)
Potassium: 4.2 mmol/L (ref 3.5–5.3)
Sodium: 141 mmol/L (ref 135–146)
Total Bilirubin: 0.6 mg/dL (ref 0.2–1.2)
Total Protein: 6.4 g/dL (ref 6.1–8.1)

## 2019-10-21 LAB — TESTOSTERONE TOTAL,FREE,BIO, MALES
Albumin: 4.2 g/dL (ref 3.6–5.1)
Sex Hormone Binding: 50 nmol/L (ref 10–50)
Testosterone, Bioavailable: 104.7 ng/dL — ABNORMAL LOW (ref >=110.0)
Testosterone, Free: 54.4 pg/mL (ref 46.0–224.0)
Testosterone: 562 ng/dL (ref 250–827)

## 2019-10-21 LAB — CBC WITH DIFFERENTIAL/PLATELET
Absolute Monocytes: 482 cells/uL (ref 200–950)
Basophils Absolute: 43 cells/uL (ref 0–200)
Basophils Relative: 0.7 %
Eosinophils Absolute: 79 cells/uL (ref 15–500)
Eosinophils Relative: 1.3 %
HCT: 46.1 % (ref 38.5–50.0)
Hemoglobin: 15.4 g/dL (ref 13.2–17.1)
Lymphs Abs: 2159 cells/uL (ref 850–3900)
MCH: 30.1 pg (ref 27.0–33.0)
MCHC: 33.4 g/dL (ref 32.0–36.0)
MCV: 90.2 fL (ref 80.0–100.0)
MPV: 10.9 fL (ref 7.5–12.5)
Monocytes Relative: 7.9 %
Neutro Abs: 3337 cells/uL (ref 1500–7800)
Neutrophils Relative %: 54.7 %
Platelets: 190 10*3/uL (ref 140–400)
RBC: 5.11 10*6/uL (ref 4.20–5.80)
RDW: 13.7 % (ref 11.0–15.0)
Total Lymphocyte: 35.4 %
WBC: 6.1 10*3/uL (ref 3.8–10.8)

## 2019-10-21 LAB — TSH: TSH: 2.19 mIU/L (ref 0.40–4.50)

## 2019-11-01 ENCOUNTER — Other Ambulatory Visit: Payer: Self-pay | Admitting: Family Medicine

## 2019-11-01 MED ORDER — OXYCODONE-ACETAMINOPHEN 5-325 MG PO TABS
1.0000 | ORAL_TABLET | Freq: Four times a day (QID) | ORAL | 0 refills | Status: DC | PRN
Start: 2019-11-01 — End: 2019-12-01

## 2019-11-01 NOTE — Telephone Encounter (Signed)
Ok to refill??  Last office visit 10/20/2019.  Last refill 10/01/2019.

## 2019-11-01 NOTE — Telephone Encounter (Signed)
Ok to refill??  Last office visit 10/20/2019.  Last refill 06/18/2019, #1 refill.

## 2019-11-14 ENCOUNTER — Other Ambulatory Visit: Payer: Self-pay | Admitting: Family Medicine

## 2019-11-15 NOTE — Telephone Encounter (Signed)
Ok to refill??  Last office visit 10/20/2019.  Last refill 08/13/2019.

## 2019-11-16 ENCOUNTER — Other Ambulatory Visit: Payer: Self-pay | Admitting: Family Medicine

## 2019-11-16 MED ORDER — AMPHETAMINE-DEXTROAMPHETAMINE 30 MG PO TABS
1.0000 | ORAL_TABLET | Freq: Two times a day (BID) | ORAL | 0 refills | Status: DC
Start: 2019-11-16 — End: 2019-12-13

## 2019-12-01 ENCOUNTER — Other Ambulatory Visit: Payer: Self-pay | Admitting: Family Medicine

## 2019-12-01 MED ORDER — OXYCODONE-ACETAMINOPHEN 5-325 MG PO TABS
1.0000 | ORAL_TABLET | Freq: Four times a day (QID) | ORAL | 0 refills | Status: DC | PRN
Start: 2019-12-01 — End: 2019-12-30

## 2019-12-01 NOTE — Telephone Encounter (Signed)
Please Advise

## 2019-12-13 ENCOUNTER — Other Ambulatory Visit: Payer: Self-pay | Admitting: Family Medicine

## 2019-12-13 MED ORDER — AMPHETAMINE-DEXTROAMPHETAMINE 30 MG PO TABS
1.0000 | ORAL_TABLET | Freq: Two times a day (BID) | ORAL | 0 refills | Status: DC
Start: 2019-12-13 — End: 2020-01-12

## 2019-12-13 NOTE — Telephone Encounter (Signed)
Ok to refill??  Last office visit 10/20/2019.  Last refill 11/16/2019.

## 2019-12-30 ENCOUNTER — Other Ambulatory Visit: Payer: Self-pay | Admitting: Family Medicine

## 2019-12-30 DIAGNOSIS — M8538 Osteitis condensans, other site: Secondary | ICD-10-CM

## 2019-12-31 MED ORDER — OXYCODONE-ACETAMINOPHEN 5-325 MG PO TABS
1.0000 | ORAL_TABLET | Freq: Four times a day (QID) | ORAL | 0 refills | Status: DC | PRN
Start: 2019-12-31 — End: 2020-02-01

## 2020-01-10 ENCOUNTER — Other Ambulatory Visit: Payer: Self-pay | Admitting: Family Medicine

## 2020-01-10 MED ORDER — OSELTAMIVIR PHOSPHATE 75 MG PO CAPS: 75.0000 mg | ORAL_CAPSULE | Freq: Two times a day (BID) | ORAL | 0 refills | Status: DC

## 2020-01-12 ENCOUNTER — Other Ambulatory Visit: Payer: Self-pay | Admitting: Family Medicine

## 2020-01-13 MED ORDER — AMPHETAMINE-DEXTROAMPHETAMINE 30 MG PO TABS
1.0000 | ORAL_TABLET | Freq: Two times a day (BID) | ORAL | 0 refills | Status: DC
Start: 2020-01-13 — End: 2020-02-13

## 2020-02-01 ENCOUNTER — Other Ambulatory Visit: Payer: Self-pay | Admitting: Family Medicine

## 2020-02-01 DIAGNOSIS — M8538 Osteitis condensans, other site: Secondary | ICD-10-CM

## 2020-02-01 MED ORDER — OXYCODONE-ACETAMINOPHEN 5-325 MG PO TABS
1.0000 | ORAL_TABLET | Freq: Four times a day (QID) | ORAL | 0 refills | Status: DC | PRN
Start: 2020-02-01 — End: 2020-03-03

## 2020-02-13 ENCOUNTER — Other Ambulatory Visit: Payer: Self-pay | Admitting: Family Medicine

## 2020-02-14 MED ORDER — AMPHETAMINE-DEXTROAMPHETAMINE 30 MG PO TABS: 1.0000 | ORAL_TABLET | Freq: Two times a day (BID) | ORAL | 0 refills | Status: DC

## 2020-02-14 NOTE — Telephone Encounter (Signed)
Ok to refill??  Last office visit 10/20/2019.  Last refill 01/13/2020.

## 2020-03-03 ENCOUNTER — Other Ambulatory Visit: Payer: Self-pay | Admitting: Family Medicine

## 2020-03-03 DIAGNOSIS — M8538 Osteitis condensans, other site: Secondary | ICD-10-CM

## 2020-03-03 NOTE — Telephone Encounter (Signed)
Ok to refill??  Last office visit 10/20/2019.   Last refill 02/01/2020.

## 2020-03-04 MED ORDER — OXYCODONE-ACETAMINOPHEN 5-325 MG PO TABS: 1.0000 | ORAL_TABLET | Freq: Four times a day (QID) | ORAL | 0 refills | Status: DC | PRN

## 2020-03-06 ENCOUNTER — Other Ambulatory Visit: Payer: Self-pay | Admitting: Family Medicine

## 2020-03-14 ENCOUNTER — Other Ambulatory Visit: Payer: Self-pay | Admitting: Family Medicine

## 2020-03-15 ENCOUNTER — Other Ambulatory Visit: Payer: Self-pay | Admitting: Family Medicine

## 2020-03-15 MED ORDER — AMPHETAMINE-DEXTROAMPHETAMINE 30 MG PO TABS: 1.0000 | ORAL_TABLET | Freq: Two times a day (BID) | ORAL | 0 refills | Status: DC

## 2020-04-07 ENCOUNTER — Other Ambulatory Visit: Payer: Self-pay | Admitting: Family Medicine

## 2020-04-07 DIAGNOSIS — M8538 Osteitis condensans, other site: Secondary | ICD-10-CM

## 2020-04-11 MED ORDER — OXYCODONE-ACETAMINOPHEN 5-325 MG PO TABS: 1.0000 | ORAL_TABLET | Freq: Four times a day (QID) | ORAL | 0 refills | Status: DC | PRN

## 2020-04-17 ENCOUNTER — Other Ambulatory Visit: Payer: Self-pay | Admitting: Family Medicine

## 2020-04-17 MED ORDER — AMPHETAMINE-DEXTROAMPHETAMINE 30 MG PO TABS: 1.0000 | ORAL_TABLET | Freq: Two times a day (BID) | ORAL | 0 refills | Status: DC

## 2020-04-17 NOTE — Telephone Encounter (Signed)
Ok to refill??  Last office visit 10/20/2019.  Last refill 03/15/2020.

## 2020-04-21 ENCOUNTER — Encounter: Payer: 59 | Admitting: Family Medicine

## 2020-05-17 ENCOUNTER — Other Ambulatory Visit: Payer: Self-pay | Admitting: Family Medicine

## 2020-05-17 NOTE — Telephone Encounter (Signed)
Ok to refill??  Last office visit 10/20/2019.  Last refill 04/17/2020.  Of note, patient has new patient appointment on 07/07/2020.

## 2020-05-18 MED ORDER — AMPHETAMINE-DEXTROAMPHETAMINE 30 MG PO TABS: 1.0000 | ORAL_TABLET | Freq: Two times a day (BID) | ORAL | 0 refills | Status: DC

## 2020-05-19 ENCOUNTER — Other Ambulatory Visit: Payer: Self-pay | Admitting: Family Medicine

## 2020-05-19 DIAGNOSIS — M8538 Osteitis condensans, other site: Secondary | ICD-10-CM

## 2020-05-19 MED ORDER — OXYCODONE-ACETAMINOPHEN 5-325 MG PO TABS: 1.0000 | ORAL_TABLET | Freq: Four times a day (QID) | ORAL | 0 refills | Status: DC | PRN

## 2020-05-19 NOTE — Telephone Encounter (Signed)
Ok to refill??  Last office visit 10/20/2019.  Last refill 04/11/2020  Of note, patient has new patient appointment on 07/07/2020

## 2020-05-29 ENCOUNTER — Encounter: Payer: Self-pay | Admitting: Nurse Practitioner

## 2020-05-29 ENCOUNTER — Other Ambulatory Visit: Payer: Self-pay

## 2020-05-29 ENCOUNTER — Telehealth: Payer: 59 | Admitting: Nurse Practitioner

## 2020-05-29 DIAGNOSIS — J0101 Acute recurrent maxillary sinusitis: Secondary | ICD-10-CM | POA: Diagnosis not present

## 2020-05-29 MED ORDER — DOXYCYCLINE HYCLATE 100 MG PO TABS: 100.0000 mg | ORAL_TABLET | Freq: Two times a day (BID) | ORAL | 0 refills | Status: AC

## 2020-05-29 MED ORDER — HYDROCOD POLST-CPM POLST ER 10-8 MG/5ML PO SUER: 5.0000 mL | Freq: Two times a day (BID) | ORAL | 0 refills | Status: AC | PRN

## 2020-05-29 MED ORDER — HYDROCOD POLST-CPM POLST ER 10-8 MG/5ML PO SUER
5.0000 mL | Freq: Two times a day (BID) | ORAL | 0 refills | Status: DC | PRN
Start: 2020-05-29 — End: 2020-05-29

## 2020-05-29 NOTE — Progress Notes (Signed)
Subjective:    Patient ID: Maurice Sims, male    DOB: [birth year], 49 y.o.   MRN: [id number]  HPI: Maurice Sims is a 50 y.o. male presenting virtually for sinus infection.  Chief Complaint  Patient presents with  . Sinus Problem    Onset 1 month ago. Went to [facility] in [place], given antibx for the infection. Worked for some time but  still lingered. Coughing disturbing sleep, taking otc mucinex, day and night robitussin, not working. Had covid 2x  once in 02/2019 and Jan of this yr. Not vaccinated for covid.   UPPER RESPIRATORY TRACT INFECTION Onset: last Monday Was treated 3 weeks ago with Augmentin; symptoms fully improved after treatment. COVID testing: not tested COVID Vaccination status: not vaccianted Fever: yes; low-grade Cough: yes; worse at night Shortness of breath: no Wheezing: no Chest pain: no Chest tightness: no Chest congestion: yes  Nasal congestion: yes Runny nose: yes Post nasal drip: yes Sneezing: no Sore throat: yes Swollen glands: yes Sinus pressure: yes; more in cheeks than above eyebrows Headache: yes Face pain: no Toothache: no Ear pain: no  Ear pressure: no  Eyes red/itching:no Eye drainage/crusting: no  Nausea: yes  Vomiting: no Diarrhea: no  Change in appetite: no  Loss of taste/smell: no  Rash: no Fatigue: yes Sick contacts: no Strep contacts: no  Context: stable Recurrent sinusitis: no Treatments attempted: mucinex, robitussin, Afrin, Ocean nasal spray, antihistamine, Sudafed Relief with OTC medications: no  No Known Allergies  Outpatient Encounter Medications as of 05/29/2020  Medication Sig Note  . amphetamine-dextroamphetamine (ADDERALL) 30 MG tablet Take 1 tablet by mouth 2 (two) times daily.   Marland Kitchen b complex vitamins tablet Take 1 tablet by mouth daily.   . clonazePAM (KLONOPIN) 1 MG tablet TAKE 1 TABLET BY MOUTH TWICE DAILY AS NEEDED FOR ANXIETY   . diclofenac (VOLTAREN) 75 MG EC tablet TAKE 1 TABLET BY  MOUTH TWICE DAILY   . doxycycline (VIBRA-TABS) 100 MG tablet Take 1 tablet (100 mg total) by mouth 2 (two) times daily.   . Multiple Vitamin (MULITIVITAMIN WITH MINERALS) TABS Take 1 tablet by mouth daily.   Marland Kitchen oxyCODONE-acetaminophen (PERCOCET) 5-325 MG tablet Take 1 tablet by mouth every 6 (six) hours as needed for severe pain. 05/29/2020: As needed for back pain   . chlorpheniramine-HYDROcodone (TUSSIONEX PENNKINETIC ER) 10-8 MG/5ML SUER Take 5 mLs by mouth every 12 (twelve) hours as needed. Take separate from pain medication to avoid sedation; do not take while driving or operating heavy machinery   . [DISCONTINUED] chlorpheniramine-HYDROcodone (TUSSIONEX PENNKINETIC ER) 10-8 MG/5ML SUER Take 5 mLs by mouth every 12 (twelve) hours as needed.   . [DISCONTINUED] oseltamivir (TAMIFLU) 75 MG capsule Take 1 capsule (75 mg total) by mouth 2 (two) times daily.    No facility-administered encounter medications on file as of 05/29/2020.    Patient Active Problem List   Diagnosis Date Noted  . Chronic back pain 01/23/2018  . ADD (attention deficit disorder) 06/12/2016  . GAD (generalized anxiety disorder) 01/02/2015  . Chronic insomnia 04/26/2014  . Diverticulitis 05/18/2013  . Abdominal pain, unspecified site 05/18/2013  . Tinea versicolor 11/19/2012    Past Medical History:  Diagnosis Date  . Anxiety   . Diverticulitis     Relevant past medical, surgical, family and social history reviewed and updated as indicated. Interim medical history since our last visit reviewed.  Review of Systems Per HPI unless specifically indicated above     Objective:  There were no vitals taken for this visit.  Wt Readings from Last 3 Encounters:  10/20/19 185 lb (83.9 kg)  05/21/19 196 lb (88.9 kg)  04/20/19 194 lb (88 kg)    Physical Exam Vitals and nursing note reviewed.  Constitutional:      General: He is not in acute distress.    Appearance: Normal appearance. He is not toxic-appearing.   HENT:     Head: Normocephalic and atraumatic.     Right Ear: External ear normal.     Left Ear: External ear normal.     Nose: No congestion.     Mouth/Throat:     Mouth: Mucous membranes are moist.     Pharynx: Oropharynx is clear.  Eyes:     General: No scleral icterus.    Extraocular Movements: Extraocular movements intact.  Cardiovascular:     Comments: Unable to assess heart sounds via virtual visit. Pulmonary:     Effort: Pulmonary effort is normal. No respiratory distress.     Comments: Unable to assess lung sounds via virtual visit.  Patient talking in complete sentences during telemedicine visit.  No accessory muscle use. Skin:    Coloration: Skin is not jaundiced or pale.     Findings: No erythema.  Neurological:     Mental Status: He is alert and oriented to person, place, and time.  Psychiatric:        Mood and Affect: Mood normal.        Behavior: Behavior normal.        Thought Content: Thought content normal.        Judgment: Judgment normal.       Assessment & Plan:  1. Acute recurrent maxillary sinusitis Acute.  Given length of symptoms and severity-likely bacterial at this point.  Will treat with doxycycline 100 mg twice daily for 7 days.  Refill given for cough syrup.  Continue symptomatic care with saline nasal spray, Mucinex, push fluids.  Follow-up if symptoms not improving near the end of the week.  - doxycycline (VIBRA-TABS) 100 MG tablet; Take 1 tablet (100 mg total) by mouth 2 (two) times daily.  Dispense: 14 tablet; Refill: 0 - chlorpheniramine-HYDROcodone (TUSSIONEX PENNKINETIC ER) 10-8 MG/5ML SUER; Take 5 mLs by mouth every 12 (twelve) hours as needed. Take separate from pain medication to avoid sedation; do not take while driving or operating heavy machinery  Dispense: 70 mL; Refill: 0    Follow up plan: Return if symptoms worsen or fail to improve.   Due to the catastrophic nature of the COVID-19 pandemic, this video visit was completed soley  via audio and visual contact via [employer] due to the restrictions of the COVID-19 pandemic.  All issues as above were discussed and addressed. Physical exam was done as above through visual confirmation on [employer]. If it was felt that the patient should be evaluated in the office, they were directed there. The patient verbally consented to this visit. . Location of the patient: home . Location of the provider: work . Those involved with this call:  . Provider: Cathlean Marseilles, DNP, FNP-C . CMA: Moises Blood, CMA . Front Desk/Registration: Claudine Mouton  . Time spent on call: 11 minutes with patient face to face via video conference. More than 50% of this time was spent in counseling and coordination of care. 15 minutes total spent in review of patient's record and preparation of their chart.  I verified patient identity using two factors (patient name and date of birth). Patient  consents verbally to being seen via telemedicine visit today.

## 2020-05-29 NOTE — Patient Instructions (Signed)
Sinusitis, Adult Sinusitis is inflammation of your sinuses. Sinuses are hollow spaces in the bones around your face. Your sinuses are located:  Around your eyes.  In the middle of your forehead.  Behind your nose.  In your cheekbones. Mucus normally drains out of your sinuses. When your nasal tissues become inflamed or swollen, mucus can become trapped or blocked. This allows bacteria, viruses, and fungi to grow, which leads to infection. Most infections of the sinuses are caused by a virus. Sinusitis can develop quickly. It can last for up to 4 weeks (acute) or for more than 12 weeks (chronic). Sinusitis often develops after a cold. What are the causes? This condition is caused by anything that creates swelling in the sinuses or stops mucus from draining. This includes:  Allergies.  Asthma.  Infection from bacteria or viruses.  Deformities or blockages in your nose or sinuses.  Abnormal growths in the nose (nasal polyps).  Pollutants, such as chemicals or irritants in the air.  Infection from fungi (rare). What increases the risk? You are more likely to develop this condition if you:  Have a weak body defense system (immune system).  Do a lot of swimming or diving.  Overuse nasal sprays.  Smoke. What are the signs or symptoms? The main symptoms of this condition are pain and a feeling of pressure around the affected sinuses. Other symptoms include:  Stuffy nose or congestion.  Thick drainage from your nose.  Swelling and warmth over the affected sinuses.  Headache.  Upper toothache.  A cough that may get worse at night.  Extra mucus that collects in the throat or the back of the nose (postnasal drip).  Decreased sense of smell and taste.  Fatigue.  A fever.  Sore throat.  Bad breath. How is this diagnosed? This condition is diagnosed based on:  Your symptoms.  Your medical history.  A physical exam.  Tests to find out if your condition is  acute or chronic. This may include: ? Checking your nose for nasal polyps. ? Viewing your sinuses using a device that has a light (endoscope). ? Testing for allergies or bacteria. ? Imaging tests, such as an MRI or CT scan. In rare cases, a bone biopsy may be done to rule out more serious types of fungal sinus disease. How is this treated? Treatment for sinusitis depends on the cause and whether your condition is chronic or acute.  If caused by a virus, your symptoms should go away on their own within 10 days. You may be given medicines to relieve symptoms. They include: ? Medicines that shrink swollen nasal passages (topical intranasal decongestants). ? Medicines that treat allergies (antihistamines). ? A spray that eases inflammation of the nostrils (topical intranasal corticosteroids). ? Rinses that help get rid of thick mucus in your nose (nasal saline washes).  If caused by bacteria, your health care provider may recommend waiting to see if your symptoms improve. Most bacterial infections will get better without antibiotic medicine. You may be given antibiotics if you have: ? A severe infection. ? A weak immune system.  If caused by narrow nasal passages or nasal polyps, you may need to have surgery. Follow these instructions at home: Medicines  Take, use, or apply over-the-counter and prescription medicines only as told by your health care provider. These may include nasal sprays.  If you were prescribed an antibiotic medicine, take it as told by your health care provider. Do not stop taking the antibiotic even if you start   to feel better. Hydrate and humidify  Drink enough fluid to keep your urine pale yellow. Staying hydrated will help to thin your mucus.  Use a cool mist humidifier to keep the humidity level in your home above 50%.  Inhale steam for 10-15 minutes, 3-4 times a day, or as told by your health care provider. You can do this in the bathroom while a hot shower is  running.  Limit your exposure to cool or dry air.   Rest  Rest as much as possible.  Sleep with your head raised (elevated).  Make sure you get enough sleep each night. General instructions  Apply a warm, moist washcloth to your face 3-4 times a day or as told by your health care provider. This will help with discomfort.  Wash your hands often with soap and water to reduce your exposure to germs. If soap and water are not available, use hand sanitizer.  Do not smoke. Avoid being around people who are smoking (secondhand smoke).  Keep all follow-up visits as told by your health care provider. This is important.   Contact a health care provider if:  You have a fever.  Your symptoms get worse.  Your symptoms do not improve within 10 days. Get help right away if:  You have a severe headache.  You have persistent vomiting.  You have severe pain or swelling around your face or eyes.  You have vision problems.  You develop confusion.  Your neck is stiff.  You have trouble breathing. Summary  Sinusitis is soreness and inflammation of your sinuses. Sinuses are hollow spaces in the bones around your face.  This condition is caused by nasal tissues that become inflamed or swollen. The swelling traps or blocks the flow of mucus. This allows bacteria, viruses, and fungi to grow, which leads to infection.  If you were prescribed an antibiotic medicine, take it as told by your health care provider. Do not stop taking the antibiotic even if you start to feel better.  Keep all follow-up visits as told by your health care provider. This is important. This information is not intended to replace advice given to you by your health care provider. Make sure you discuss any questions you have with your health care provider. Document Revised: 06/02/2017 Document Reviewed: 06/02/2017 Elsevier Patient Education  2021 [employer]  

## 2020-06-19 ENCOUNTER — Other Ambulatory Visit: Payer: Self-pay | Admitting: Family Medicine

## 2020-06-19 DIAGNOSIS — M8538 Osteitis condensans, other site: Secondary | ICD-10-CM

## 2020-06-19 NOTE — Telephone Encounter (Signed)
Ok to refill??  Last office visit 05/29/2020.  Last refill 05/19/2020.  Patient is transferring care; initial appointment is 07/07/2020.

## 2020-06-19 NOTE — Telephone Encounter (Signed)
Ok to refill??  Last office visit 05/29/2020.  Last refill 05/18/2020.  Patient is transferring care; initial appointment is 07/07/2020.

## 2020-06-21 MED ORDER — OXYCODONE-ACETAMINOPHEN 5-325 MG PO TABS: 1.0000 | ORAL_TABLET | Freq: Four times a day (QID) | ORAL | 0 refills | Status: AC | PRN

## 2020-06-21 MED ORDER — AMPHETAMINE-DEXTROAMPHETAMINE 30 MG PO TABS: 1.0000 | ORAL_TABLET | Freq: Two times a day (BID) | ORAL | 0 refills | Status: AC

## 2020-06-21 NOTE — Telephone Encounter (Signed)
PDMP reviewed.  Appears patient takes this medication chronically.  Refill given until new patient appointment with new PCP.

## 2020-06-21 NOTE — Telephone Encounter (Signed)
PDMP reviewed.  Refill given to last until new patient appointment with new pcp.

## 2020-07-19 IMAGING — CR DG LUMBAR SPINE COMPLETE 4+V
5 series · 5 of 5 positions shown · non-contrast
Comparison: August 26, 2011

CLINICAL DATA: Lumbago with radicular symptoms

EXAM:
LUMBAR SPINE - COMPLETE 4+ VIEW

[w lumbar spine ap]
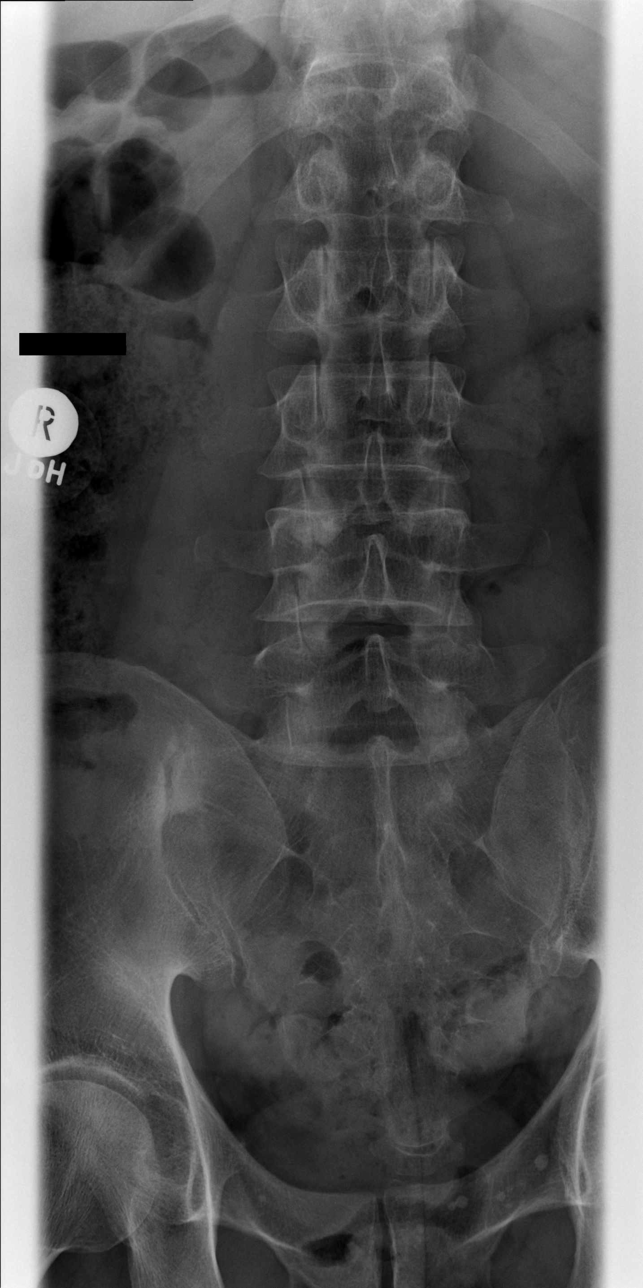

[w lumbar spine obl (1 of 2)]
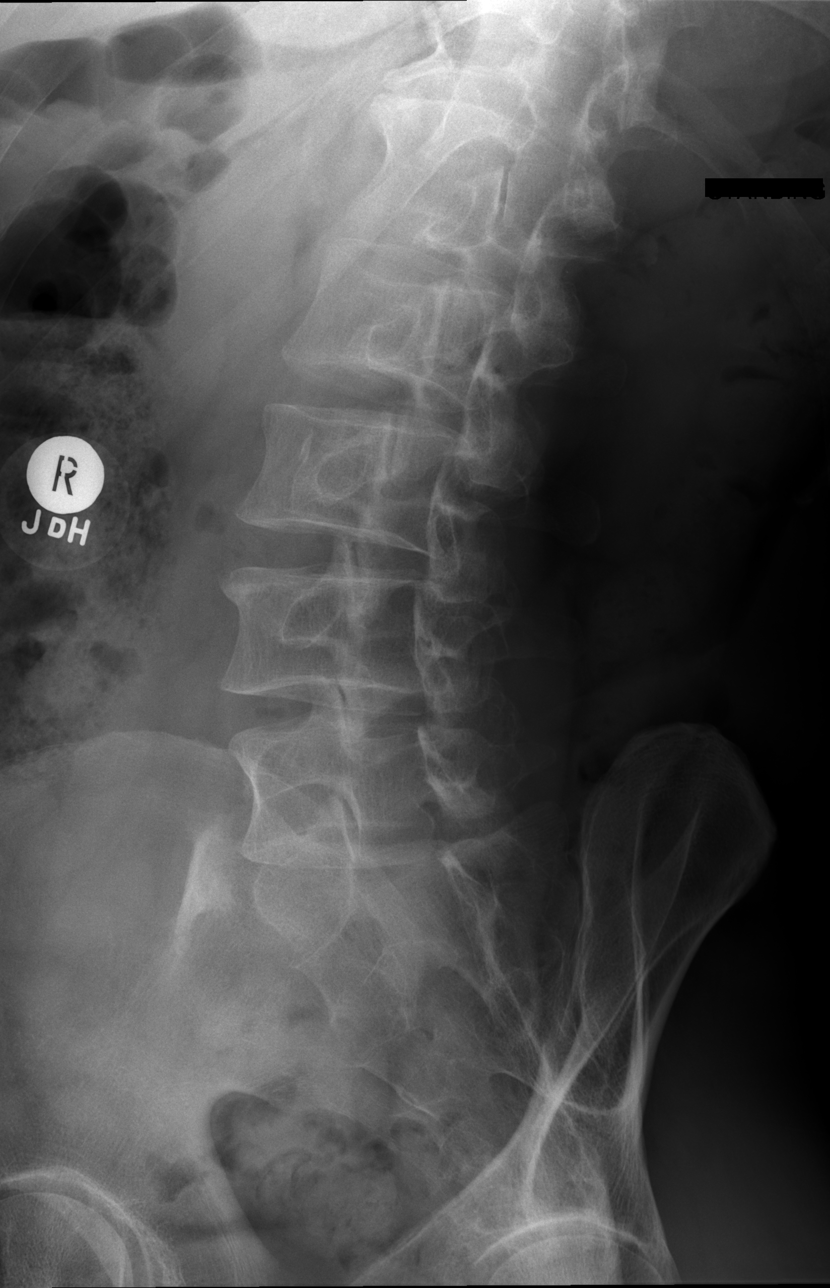

[w lumbar spine obl (2 of 2)]
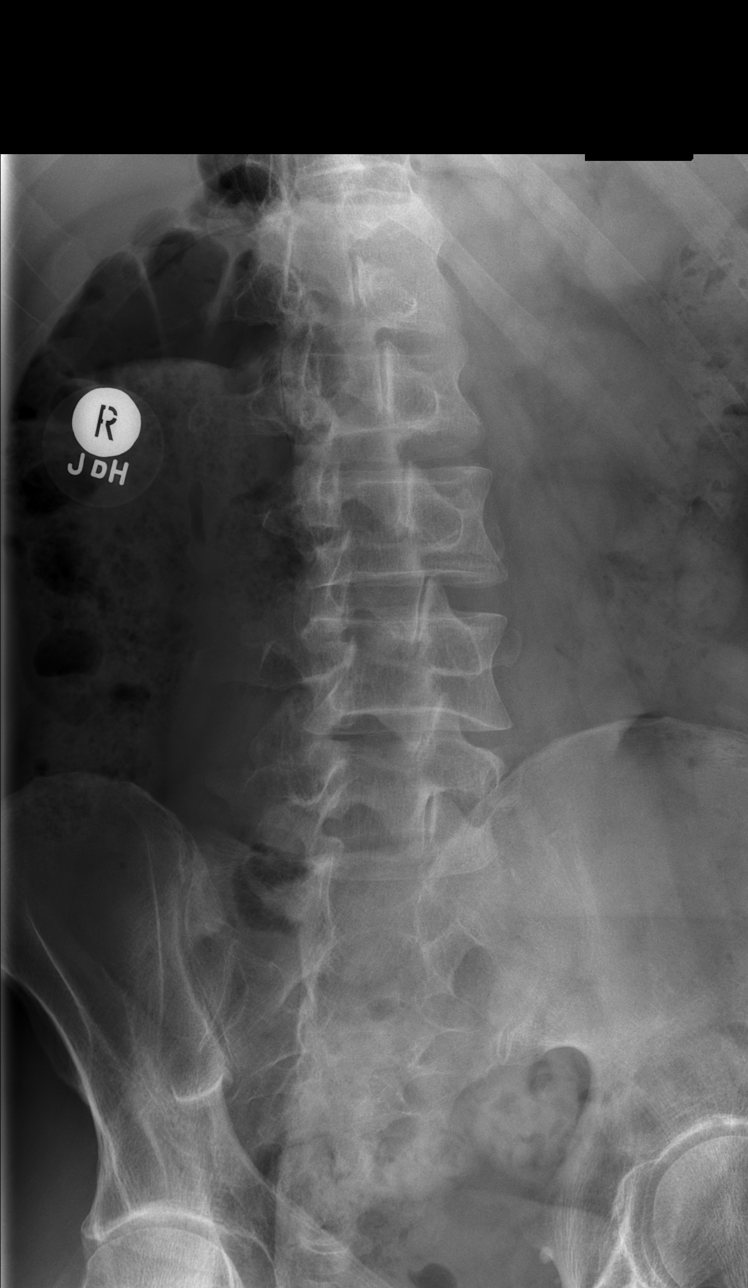

[w lumbar spine lat]
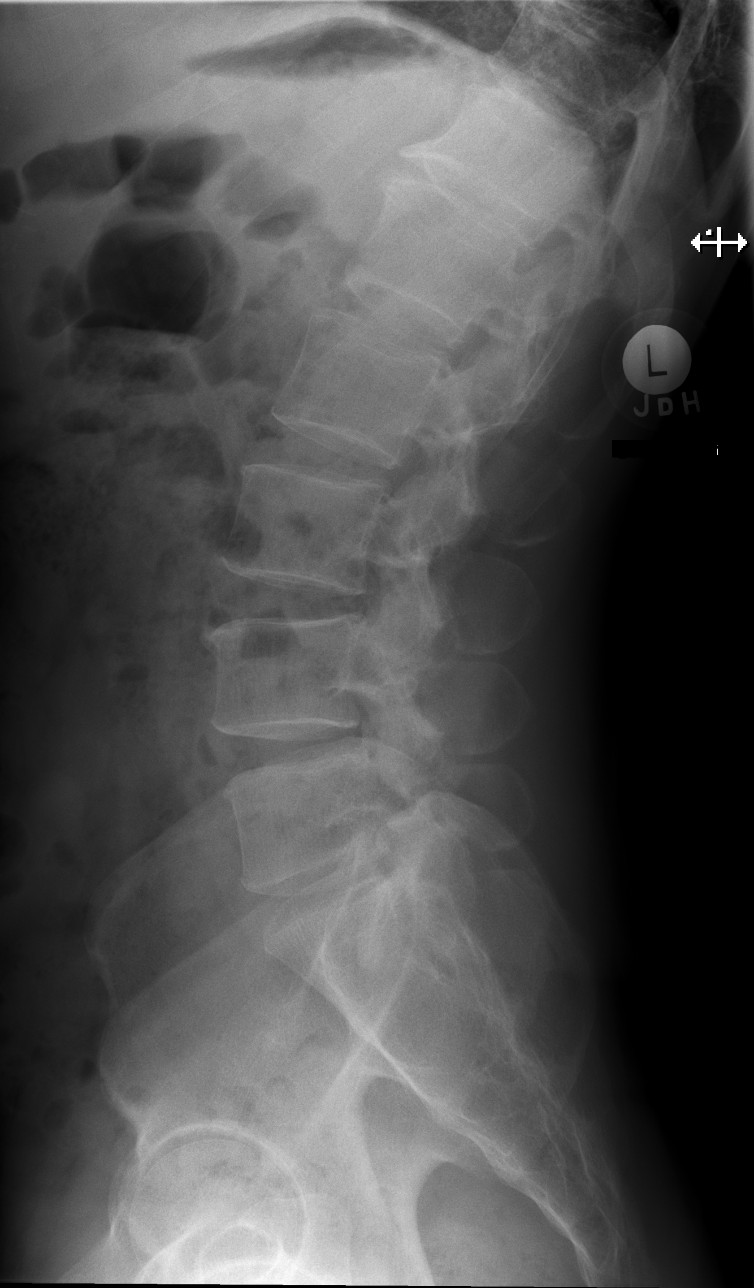

[w lumbar l-5 s-1 spot]
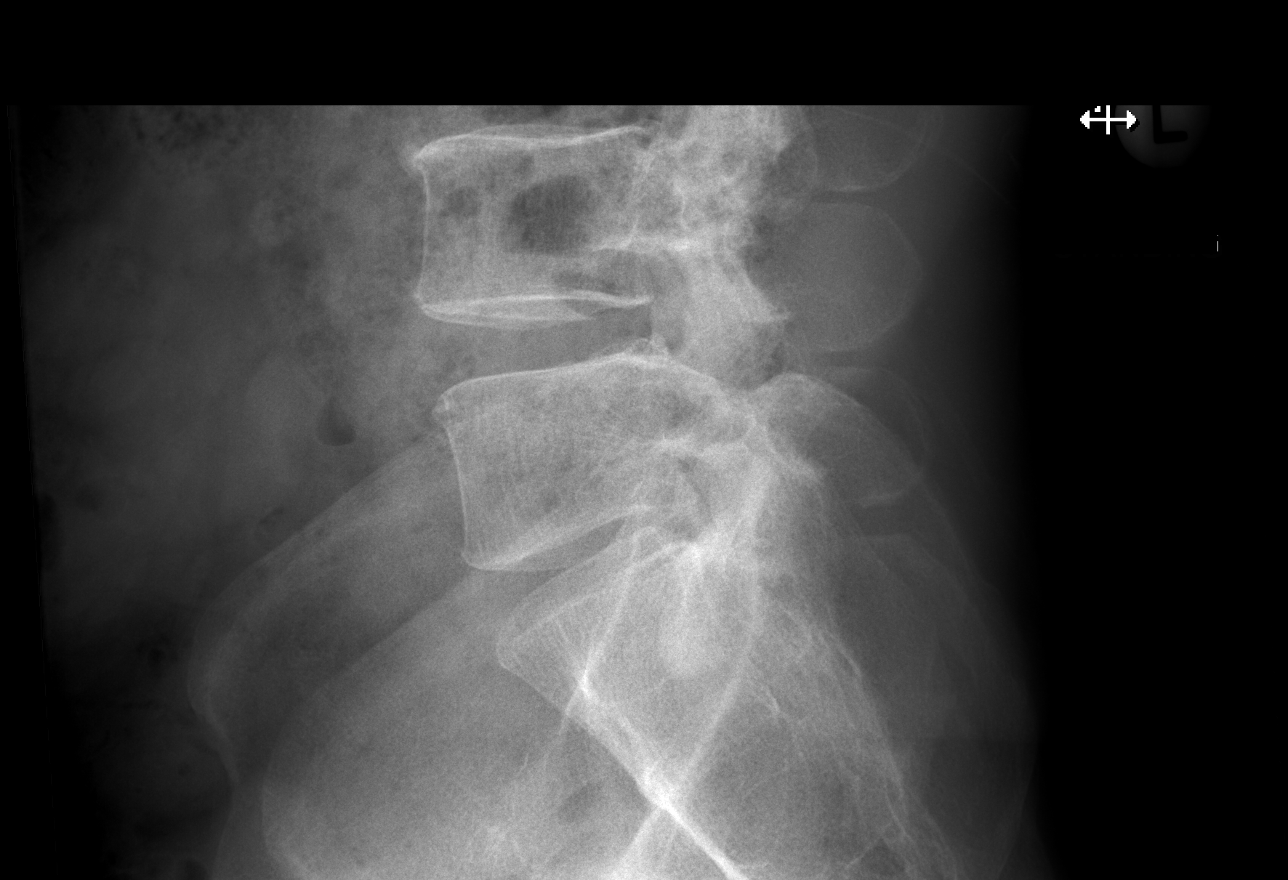

[5 of 5 positions shown; findings below may reference images not displayed]

FINDINGS: Standing frontal, standing lateral, standing spot lumbosacral
lateral, and standing bilateral oblique views were obtained. There
are 5 non-rib-bearing lumbar type vertebral bodies. There is no
fracture or spondylolisthesis. There is moderate disc space
narrowing at L5-S1. Other disc spaces appear unremarkable. There is
no appreciable facet arthropathy. There is mild osteitis condensans
[place] on the right.
IMPRESSION: Disc space narrowing at L5-S1. Other disc spaces appear normal. No
fracture or spondylolisthesis. There is osteitis condensans [place] on
the right.

## 2022-05-27 ENCOUNTER — Other Ambulatory Visit: Payer: Self-pay | Admitting: Family

## 2022-05-27 DIAGNOSIS — M545 Low back pain, unspecified: Secondary | ICD-10-CM

## 2022-05-27 DIAGNOSIS — M5116 Intervertebral disc disorders with radiculopathy, lumbar region: Secondary | ICD-10-CM

## 2022-05-31 ENCOUNTER — Other Ambulatory Visit: Payer: 59

## 2022-06-02 ENCOUNTER — Other Ambulatory Visit: Payer: 59

## 2022-06-02 ENCOUNTER — Ambulatory Visit
Admission: RE | Admit: 2022-06-02 | Discharge: 2022-06-02 | Disposition: A | Discharge status: Home / Self Care | Payer: 59 | Source: Ambulatory Visit | Attending: Family | Admitting: Family

## 2022-06-02 DIAGNOSIS — M545 Low back pain, unspecified: Secondary | ICD-10-CM

## 2022-06-02 DIAGNOSIS — M5116 Intervertebral disc disorders with radiculopathy, lumbar region: Secondary | ICD-10-CM

## 2023-12-19 ENCOUNTER — Other Ambulatory Visit: Payer: Self-pay | Admitting: Emergency Medicine

## 2023-12-19 DIAGNOSIS — M5430 Sciatica, unspecified side: Secondary | ICD-10-CM

## 2023-12-19 DIAGNOSIS — M549 Dorsalgia, unspecified: Secondary | ICD-10-CM

## 2024-01-22 ENCOUNTER — Ambulatory Visit: Attending: Orthopedic | Admitting: Physical Therapy

## 2024-01-22 DIAGNOSIS — M5416 Radiculopathy, lumbar region: Secondary | ICD-10-CM | POA: Insufficient documentation

## 2024-01-22 DIAGNOSIS — M6281 Muscle weakness (generalized): Secondary | ICD-10-CM | POA: Insufficient documentation

## 2024-01-22 DIAGNOSIS — M5459 Other low back pain: Secondary | ICD-10-CM | POA: Insufficient documentation

## 2024-01-22 NOTE — Therapy (Signed)
 " OUTPATIENT PHYSICAL THERAPY EVALUATION   Patient Name: Maurice Sims MRN: [id number] DOB:1970/03/19, 54 y.o., male Today's Date: 01/23/2024  END OF SESSION:  PT End of Session - 01/23/24 1154     Visit Number 1    Number of Visits 17    Date for Recertification  04/15/24    Authorization Type [employer] reporting period from 01/22/2024    Authorization Time Period [facility] 08/15/23-08/13/24  VL 23    Authorization - Visit Number 1    Authorization - Number of Visits 23    Progress Note Due on Visit 10    PT Start Time 1740    PT Stop Time 1820    PT Time Calculation (min) 40 min    Activity Tolerance Patient tolerated treatment well    Behavior During Therapy [facility] for tasks assessed/performed          Past Medical History:  Diagnosis Date   Anxiety    Diverticulitis    Past Surgical History:  Procedure Laterality Date   BACK SURGERY     ELBOW SURGERY     HERNIA REPAIR     Patient Active Problem List   Diagnosis Date Noted   Chronic back pain 01/23/2018   ADD (attention deficit disorder) 06/12/2016   GAD (generalized anxiety disorder) 01/02/2015   Chronic insomnia 04/26/2014   Diverticulitis 05/18/2013   Abdominal pain 05/18/2013   Tinea versicolor 11/19/2012    PCP: Sophronia Ozell BROCKS, MD   REFERRING PROVIDER: Garnette Ozell Hamilton, NP  REFERRING DIAG: spinal stenosis of lumbar region without neurogenetic claudication, acute pain of left knee, left lumbar radiculitis, hx of lumbar discectomy  Rationale for Evaluation and Treatment: Rehabilitation  THERAPY DIAG:  Other low back pain  Radiculopathy, lumbar region  Muscle weakness (generalized)  ONSET DATE: generalized low back pain chronic since episode of R radiculopathy treated with L5-S1 discectomy in 2020. Most recent episode of acute L lumbar radiculopathy started about 1 week prior to Thanksgiving 2025  SUBJECTIVE:                                                                                                                                                                                            SUBJECTIVE STATEMENT: Patient is here for PT evaluation of low back pain using MDT/McKenzie approach by Cert. MDT physical therapist. See below for further subjective information.   PERTINENT HISTORY:  Patient is a 54 y.o. male who presents to outpatient physical therapy with a referral for medical diagnosis spinal stenosis of lumbar region without neurogenetic claudication, acute pain of left knee, left lumbar radiculitis, hx of lumbar discectomy  and request for MDT/McKenzie approach. This patient's chief complaints consist of chronic low back pain with intermittent shooting into B posterior hips with resolving recent episode of L radicular pain and weakness to the foot  leading to the following functional deficits: difficulty lifting, (work, strength training, daily activities), sleeping, sitting, exercising. Relevant past medical history and comorbidities include the following: Tinea versicolor; Diverticulitis; Abdominal pain; Chronic insomnia; GAD (generalized anxiety disorder); ADD (attention deficit disorder); and Chronic back pain on their problem list. he  has a past medical history of Anxiety and Diverticulitis. he  has a past surgical history that includes Hernia repair; Elbow surgery; and Back surgery. Patient denies hx of cancer, stroke, seizures, lung problems, heart problems, diabetes, unexplained weight loss, unexplained changes in bowel or bladder problems, unexplained stumbling or dropping things, and osteoporosis  PAIN: Are you having pain? Yes NPRS: Current: 1/10,  Best: 1/10, Worst: 10/10. (4/10 in last 2 weeks) Pain location: low back and glutes Pain description: aching in low back, glutes/hips, shooting pain to the buttocks. Sore to touch. Occasional paresthesia R > L. Aggravating factors: see below Relieving factors: see below   PRECAUTIONS: None  WEIGHT BEARING  RESTRICTIONS: No  FALLS:  Has patient fallen in last 6 months? No  PATIENT GOALS: Feel better, to really learn what he should and shouldn't be doing.    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 He is mostly behind a computer during the day. Standing, sitting, walking, ladders, heavy lifting, wire pulling (not doing as much of this recently).  Leisure Activities: shooting, fishing, for working out: light lifting and walking  Functional Limitation for Present Episode: difficulty lifting, (work, runner, broadcasting/film/video, daily activities), sleeping, sitting, exercising.  Outcome / Screening Score:  MODIFIED OSWESTRY DISABILITY SCALE  Date: 01/22/24 Score  Pain intensity 1 = The pain is bad, but I can manage without having to take pain medication  2. Personal care (washing, dressing, etc.) 0 =  I can take care of myself normally without causing increased pain.  3. Lifting 1 = I can lift heavy weights, but it causes increased pain.  4. Walking 0 = Pain does not prevent me from walking any distance  5. Sitting 2 =  Pain prevents me from sitting more than 1 hour.  6. Standing 0 =  I can stand as long as I want without increased pain.  7. Sleeping 2 =  Even when I take pain medication, I sleep less than 6 hours  8. Social Life 1 =  My social life is normal, but it increases my level of pain.  9. Traveling 1 =  I can travel anywhere, but it increases my pain.  10. Employment/ Homemaking 1 = My normal homemaking/job activities increase my pain, but I can still perform all that is required of me  Total 9/50 (18%)  Minimally Clinically Important Difference (MCID) = 12.8%  NPRS (0-10): Current: 1/10,  Best: 1/10, Worst: 10/10. (4/10 in last 2 weeks) Present Symptoms: low back and glutes Quality: aching in low back, glutes/hips, shooting pain to the  buttocks. Sore to touch. Occasional paresthesia R > L.  Present Since: week before Thanksgiving 2024, improving Commenced As a Result of: Patient states this episode started a week before Thanksgiving. He started feeling pain behind his left knee out of nowhere. He has chronic back pain in his  lower back, glutes and hips. He has done a pretty poor job about it the last year besides walking. A week after this episode started he could feel the connection between the back of his leg and his back. It then spread to his foot. He could not sleep in his recliner. Then it was like someone flillped a light switch and hte leg pain went away. The pain down the leg lasted about 3 weeks.  Symptoms at Onset: thigh Constant Symptoms: back Intermittent Symptoms: back in glutes, feels like electrical shock.   Worse - Bending: Sometimes - Sitting: Sometimes - Rising: Always - Standing: Never - Walking: Sometimes - Lying: Never - AM: Always - As the Day Progresses: Sometimes - PM: Sometimes - When Still: Sometimes - On the Move: Sometimes  Better - Bending: Sometimes - Sitting: Sometimes - Standing: Sometimes - Walking: Sometimes - Lying: Always - AM: Never - As the Day Progresses: Always - PM: Sometimes - When Still: Never - On the Move: Always - Other: ice  Sleep and Rest  Disturbed Sleep: Yes: was disturbed, but is back to baseline now Sleeping Postures: back, and both sides Surface: firm  Medical History  Previous Spinal History: generalized low back pain chronic since episode of R radiculopathy treated with L5-S1 discectomy in 2020 Previous Treatments: prior diskectomy (helped), previous injections (did not help), chiropractor (goes 2x a month, helpful for this episode).    SPECIFIC QUESTIONS - Cough: Yes, Sneeze: Yes, Strain: Yes - Bladder/Bowel: normal now but initiating urination was sometimes difficulty during the first part of this episode, also hard feel that he was urinating  (back to normal now).  - Gait: it affected his gait but he is back to prettynormal.  Medications: see chart General Health/Comorbidities:  Tinea versicolor; Diverticulitis; Abdominal pain; Chronic insomnia; GAD (generalized anxiety disorder); ADD (attention deficit disorder); and Chronic back pain on their problem list. he  has a past medical history of Anxiety and Diverticulitis. he  has a past surgical history that includes Hernia repair; Elbow surgery; and Back surgery. Patient denies hx of cancer, stroke, seizures, lung problems, heart problems, diabetes, unexplained weight loss, unexplained changes in bowel or bladder problems, unexplained stumbling or dropping things, and osteoporosis  Recent/Relevant Surgery: No History of Cancer: No Unexplained Weight Loss: No History of Trauma: No Imaging: Yes:   Lumbar MRI report from 512/05/2023:  EXAM: Magnetic resonance imaging, spinal canal and contents, lumbar, without contrast material.  DATE: 12/19/2023 2:23 PM  ACCESSION: [id number]  DICTATED: 12/19/2023 2:33 PM  INTERPRETATION LOCATION: [facility]   CLINICAL INDICATION: 54 years old Male with LLE weakness    COMPARISON: None   TECHNIQUE: Multiplanar MRI was performed through the lumbar spine without intravenous contrast.   FINDINGS:  Motion and susceptibility artifacts as well as high image noise limits evaluation. Within these limitations;   Bone marrow signal intensity is normal. Mild edematous degenerative signal changes at L5-S1. Distal spinal cord is not clearly visualized due to significant artifacts, however the conus medullaris ends at a normal level (at L1-L2).   Grade 1 anterolisthesis of L3 on L4 and L4 on L5. Grade 1 retrolisthesis of L5 on S1. Multilevel disc desiccation and disc base height loss most pronounced at L5-S1. Congenital diffuse spinal canal narrowing due to short pedicles.   L1-L2: No herniation. No spinal canal or neural foraminal narrowing.    L2-L3: No herniation. No significant spinal canal or neural foraminal narrowing.   L3-L4: Diffuse circumferential disc bulge and  uncovering of posterior disc contour due to anterolisthesis, ligamentum flavum thickening and facet arthropathy with combination of congenitally short pedicles resulting in moderate spinal canal narrowing. Effacement of lateral recesses. Mild to moderate bilateral neural foraminal narrowing.   L4-L5: Diffuse circumferential disc bulge and uncovering of posterior disc contour due to anterolisthesis, ligamentum flavum thickening and facet arthropathy with combination of congenital short pedicles resulting in severe spinal canal narrowing. Effacement of bilateral lateral recesses. Bilateral moderate neural foraminal narrowing, left greater than right. Bilateral mild facet joint effusion.   L5-S1: Disc bulge, mild retrolisthesis, mild ligamentum flavum thickening and facet arthropathy with combination of congenital short pedicles resulting in mild to moderate spinal canal narrowing. Effacement of bilateral lateral recesses. Moderate right, moderate to severe left neural foraminal narrowing.   The paraspinal tissues are within normal limits.   For the purposes of this dictation, the lowest well formed intervertebral disc space is assumed to be the L5-S1 level, and there are presumed to be five lumbar-type vertebral bodies.   IMPRESSION:  Motion/susceptibility artifacts and high image noise limits evaluation. Within this limitation;  Multilevel lumbar spondylosis and degenerative disc disease most pronounced at L4-L5 and L5-S1. At L4-L5 there is severe spinal canal and left greater than right moderate neural foraminal narrowing. At L5-S1, there is moderate right, moderate to severe left neural foraminal narrowing.   Patient Goals/Expectations: Feel better, to really learn what he should and shouldn't be doing.  OBJECTIVE  EXAMINATION  Sitting Posture: slumped Change of  Posture Effect: worse across low back Standing Posture: lordotic slight right sided convexity Lateral Shift: right slight Shift Relevance: No   Neurological Motor Deficit:  SLS slightly more wobbly on L LE compared to right  Myotomes (supine) Ankle Eversion (S1) R: 5/5 L: 4/5   Anterior Tibialis (L4-L5) R: 5/5 L: 3+/5  (no better after REIL) Reflexes: deferred Sensory Deficit: deferred Neurodynamic Tests:  Straight Leg Raise  From supine R  = negative L  = positive  Movement Loss (AROM) Movement Loss Symptoms  Flexion min Fingers to ankles (normal can reach the floor), pulling in hamstrings  Extension mod Dull ache in low back  Side Gliding R nil Left low back pain   Side Gliding L nil Left low back pain    Repeated Motions Testing Pre-Test Symptoms Test Movement Symptom During Symptom After Mechanical Response Key Functional Test  Ache in low back REIL 1x10 no effect/feels good no effect  L ankle DF (L4 myotome) no change     TREATMENT                                                                                                                          Therapeutic exercise: therapeutic exercises that incorporate ONE parameter at one or more areas of the body to centralize symptoms, develop strength and endurance, range of motion, and flexibility required for successful completion of functional activities. REIL 1x10 Education on MDT/McKenzie approach, POC, prognosis, diagnosis, role of HEP,  centralization, peripheralization, nerve tension, self management, exercises, HEP.   PATIENT EDUCATION:  Education details: Exercise purpose/form. Self management techniques. Education on diagnosis, prognosis, POC, anatomy and physiology of current condition. Education on HEP Person educated: Patient Education method: Medical Illustrator Education comprehension: verbalized understanding, returned demonstration, and needs further education  HOME EXERCISE PROGRAM: REIL  1x15 every 2-4 hours Avoid hamstring stretch  ASSESSMENT:  CLINICAL IMPRESSION: Patient is a 54 y.o. male referred to outpatient physical therapy with a medical diagnosis of spinal stenosis of lumbar region without neurogenetic claudication, acute pain of left knee, left lumbar radiculitis, hx of lumbar discectomy who presents with signs and symptoms consistent with chronic low back pain with resolving sub-acute L lumbar radiculopathy, likely mostly affecting L4. MDT classification of lumbar derangement syndrome with extension preference vs OTHER: mechanically inconclusive. Patient has significant loss of lumbar extension ROM, history of left sided radicular pain and weakness affecting function, and currently positive left SLR test and diminished L sided dermatomes at L4> S1 (others not tested today), and decreased L SLS balance. Patient tolerated REIL well but had no change in myotomal strength after one set. Plan to retest after patient has practiced this at home over several days. Patient appears to be in the maintenance of reduction phase of rehab and will likely be ready for reconditioning of the low back and function as his neural signs improve. Patient presents with significant pain, ROM, paresthesia, muscle tension, joint stiffness, neural tension, muscle performance (strength/power/endurance), balance, and activity tolerance impairments that are limiting ability to complete usual activities that require/include lifting, (work, runner, broadcasting/film/video, daily activities), sleeping, sitting, exercising without difficulty. Patient will benefit from skilled physical therapy intervention to address current body structure impairments and activity limitations to improve function and work towards goals set in current POC in order to return to prior level of function or maximal functional improvement.   Mechanical sensitivities:   MDT Assessment Provisional Classification: Derangement syndrome with extension  preference vs. Other: mechanically inconclusive Stage: healing  OBJECTIVE IMPAIRMENTS: Abnormal gait, decreased activity tolerance, decreased balance, decreased coordination, decreased endurance, decreased knowledge of condition, decreased mobility, difficulty walking, decreased ROM, decreased strength, hypomobility, increased muscle spasms, impaired flexibility, postural dysfunction, and pain.   ACTIVITY LIMITATIONS: carrying, lifting, bending, sitting, squatting, sleeping, and locomotion level  PARTICIPATION LIMITATIONS: community activity and occupation  PERSONAL FACTORS: Fitness, Past/current experiences, Profession, Time since onset of injury/illness/exacerbation, and 3+ comorbidities:  Tinea versicolor; Diverticulitis; Abdominal pain; Chronic insomnia; GAD (generalized anxiety disorder); ADD (attention deficit disorder); and Chronic back pain on their problem list. he  has a past medical history of Anxiety and Diverticulitis. he  has a past surgical history that includes Hernia repair; Elbow surgery; and Back surgery are also affecting patient's functional outcome.   REHAB POTENTIAL: Good  CLINICAL DECISION MAKING: Evolving/moderate complexity  EVALUATION COMPLEXITY: Moderate   GOALS: Goals reviewed with patient? No  SHORT TERM GOALS: Target date: 02/06/2024  Patient will be independent with initial home exercise program for self-management of symptoms. Baseline: Initial HEP provided at IE (01/23/2024); Goal status: INITIAL   LONG TERM GOALS: Target date: 04/15/2024  Patient will be independent with a long-term home exercise program for self-management of symptoms.  Baseline: Initial HEP provided at IE (01/22/2024); Goal status: INITIAL  2.  Patient will demonstrate improved Modified Oswestry Disability Index (mODI) to equal or less than 10% to demonstrate improvement in overall condition and self-reported functional ability.  Baseline: 18% (01/22/2024); Goal status: INITIAL  3.   Patient  will demonstrate the ability to complete 10 consecutive squats loaded with sufficient weight for RPE of at least 6/10 while demonstrating sufficient bracing technique and form without increased symptoms to help him return to weight lifting and heavier work activities. Baseline: reports difficulty with lifting (01/22/2024); Goal status: INITIAL  4.  Patient will demonstrate equal neurodynamic and myotome testing in B LE to demonstrate readiness for return to heavier activities needed for work and exercise.  Baseline: L SLR positive, diminished L myotome at L4 and S1 (01/22/2024); Goal status: INITIAL  5.  Patient will demonstrate improvement in Patient Specific Functional Scale (PSFS) of equal or greater than 8/10 points to reflect clinically significant improvement in patient's most valued functional activities. Baseline: to be measured at visit 2 as appropriate (01/22/2024); Goal status: INITIAL  6.  Patient will report NPRS equal or less than 3/10 during functional activities during the last 2 weeks to improve their abilitly to complete community, work and/or recreational activities with less limitation. Baseline: 10/10 (01/22/2024); Goal status: INITIAL  PLAN:  PT FREQUENCY: 2x/week  PT DURATION: 8-12 weeks  PLANNED INTERVENTIONS: [id number]- PT Re-evaluation, 97750- Physical Performance Testing, 97110-Therapeutic exercises, 97530- Therapeutic activity, [id number]- Neuromuscular re-education, 97535- Self Care, [id number]- Manual therapy, [id number]- Gait training, [phone]- Electrical stimulation (unattended), 20560 (1-2 muscles), 20561 (3+ muscles)- Dry Needling, Patient/Family education, Balance training, Cryotherapy, and Moist heat.  PLAN FOR NEXT SESSION: confirm or exclude MDT classification, start interventions to address education and motor control/strength/endurance deficits without irritating healing radiculopathy or interfering with MDT process. MDT Plan PRINCIPLES OF MANAGEMENT Education:  centralization/peripheralization, nerve root irritation Exercise Type: REIL  Frequency: 1x15 every 2-4 hours  Other Exercises / Interventions: avoid stretching hamstrings (neural tension) Management Goals: confirm or exclude provisional classification  Reema Chick SPT  Camie SAUNDERS. Juli, PT, DPT, Cert. MDT, PRA-C 01/23/2024, 12:34 PM  [facility] [address] [place], [place] [address] P: [phone] I F: [phone]  "

## 2024-01-23 ENCOUNTER — Encounter: Payer: Self-pay | Admitting: Physical Therapy

## 2024-01-27 ENCOUNTER — Encounter: Payer: Self-pay | Admitting: Physical Therapy

## 2024-01-27 ENCOUNTER — Ambulatory Visit: Admitting: Physical Therapy

## 2024-01-27 VITALS — BP 146/89 | HR 76

## 2024-01-27 DIAGNOSIS — M6281 Muscle weakness (generalized): Secondary | ICD-10-CM

## 2024-01-27 DIAGNOSIS — M5459 Other low back pain: Secondary | ICD-10-CM | POA: Diagnosis not present

## 2024-01-27 DIAGNOSIS — M5416 Radiculopathy, lumbar region: Secondary | ICD-10-CM

## 2024-01-27 NOTE — Therapy (Signed)
 " OUTPATIENT PHYSICAL THERAPY TREATMENT   Patient Name: Maurice Sims MRN: [id number] DOB:10-Nov-1970, 54 y.o., male Today's Date: 01/27/2024  END OF SESSION:  PT End of Session - 01/27/24 1746     Visit Number 2    Number of Visits 17    Date for Recertification  04/15/24    Authorization Type [employer] reporting period from 01/22/2024    Authorization Time Period [facility] 08/15/23-08/13/24  VL 23    Authorization - Visit Number 2    Authorization - Number of Visits 23    Progress Note Due on Visit 10    PT Start Time 1733    PT Stop Time 1820    PT Time Calculation (min) 47 min    Activity Tolerance Patient tolerated treatment well    Behavior During Therapy [facility] for tasks assessed/performed           Past Medical History:  Diagnosis Date   Anxiety    Diverticulitis    Past Surgical History:  Procedure Laterality Date   BACK SURGERY     ELBOW SURGERY     HERNIA REPAIR     Patient Active Problem List   Diagnosis Date Noted   Chronic back pain 01/23/2018   ADD (attention deficit disorder) 06/12/2016   GAD (generalized anxiety disorder) 01/02/2015   Chronic insomnia 04/26/2014   Diverticulitis 05/18/2013   Abdominal pain 05/18/2013   Tinea versicolor 11/19/2012    PCP: Sophronia Ozell BROCKS, MD   REFERRING PROVIDER: Garnette Ozell Hamilton, NP  REFERRING DIAG: spinal stenosis of lumbar region without neurogenetic claudication, acute pain of left knee, left lumbar radiculitis, hx of lumbar discectomy  Rationale for Evaluation and Treatment: Rehabilitation  THERAPY DIAG:  Other low back pain  Radiculopathy, lumbar region  Muscle weakness (generalized)  ONSET DATE: generalized low back pain chronic since episode of R radiculopathy treated with L5-S1 discectomy in 2020. Most recent episode of acute L lumbar radiculopathy started about 1 week prior to Thanksgiving 2025  SUBJECTIVE:                                                                                                                                                                                            PERTINENT HISTORY:  Patient is a 54 y.o. male who presents to outpatient physical therapy with a referral for medical diagnosis spinal stenosis of lumbar region without neurogenetic claudication, acute pain of left knee, left lumbar radiculitis, hx of lumbar discectomy and request for MDT/McKenzie approach. This patient's chief complaints consist of chronic low back pain with intermittent shooting into B posterior hips with resolving recent episode of  L radicular pain and weakness to the foot  leading to the following functional deficits: difficulty lifting, (work, runner, broadcasting/film/video, daily activities), sleeping, sitting, exercising. Relevant past medical history and comorbidities include the following: Tinea versicolor; Diverticulitis; Abdominal pain; Chronic insomnia; GAD (generalized anxiety disorder); ADD (attention deficit disorder); and Chronic back pain on their problem list. he  has a past medical history of Anxiety and Diverticulitis. he  has a past surgical history that includes Hernia repair; Elbow surgery; and Back surgery. Patient denies hx of cancer, stroke, seizures, lung problems, heart problems, diabetes, unexplained weight loss, unexplained changes in bowel or bladder problems, unexplained stumbling or dropping things, and osteoporosis  SUBJECTIVE STATEMENT: Patient states he is feeling about the same. He states he got pretty sore on Sunday. His back locked up a little and He had a momentary bout of tingly pain in top of buttocks. Over the course of the time since he was last at PT he thinks things might be a little bit better. He currently has some pain when he moves a little. He states he doesn't take as long to get going in the morning and he has less stiffness. He did start to do partial press ups when his friend told him his hips should stay down on the mat. He did his exercises  about every 2 hours for the most part.   PAIN: NPRS: 0/10   From initial PT evaluation on 01/22/24: NPRS: Current: 1/10,  Best: 1/10, Worst: 10/10. (4/10 in last 2 weeks) Pain location: low back and glutes Pain description: aching in low back, glutes/hips, shooting pain to the buttocks. Sore to touch. Occasional paresthesia R > L. Aggravating factors: see below Relieving factors: see below   PRECAUTIONS: None  WEIGHT BEARING RESTRICTIONS: No  FALLS:  Has patient fallen in last 6 months? No  PATIENT GOALS: Feel better, to really learn what he should and shouldn't be doing.     OBJECTIVE  Vitals:   01/27/24 1747  BP: (!) 146/89  Pulse: 76  SpO2: 99%   SELF-REPORTED FUNCTION THE PATIENT SPECIFIC FUNCTIONAL SCALE Place score of 0-10 (0 = unable to perform activity and 10 = able to perform activity at the same level as before injury or problem) Activity Date: 01/27/24    Working out 5/10    2. Hiking 6/10    3. Laying - Stand 6/10    4.      Total Score 5.7/10    Total Score = Sum of activity scores/number of activities Minimally Detectable Change: 3 points (for single activity); 2 points (for average score) [facility] (nd). The Patient Specific Functional Scale . Retrieved from [email or web address]    Movement Loss (AROM) Movement Loss Symptoms  Flexion min Fingers to ankles (normal can reach the floor), pulling in hamstrings  Extension mod Catch when returning  Side Gliding R nil Left low back pain   Side Gliding L nil Left low back pain   MOTOR DEFICIT:  SLS heel raise with B UE hand held support Able to do 10 reps both sides full ROM but needed more assistance with balance on the left initially than the right.   Myotomes (supine) Great Toe Extension (L5) R: 5/5 L: 4+/5  Ankle Eversion (S1) R: 5/5 L: 4+/5   Anterior Tibialis (L4) R: 5/5 L: 3+/5  (no better after lumbar traction) Knee  Extension (L3) R: unable to break L: unable to break    TREATMENT  Therapeutic exercise: therapeutic exercises that incorporate ONE parameter at one or more areas of the body to centralize symptoms, develop strength and endurance, range of motion, and flexibility required for successful completion of functional activities.  Vitals measurement for system's review (see above).   Lumbar motion and motor deficit testing to assess response to interventions (see above)  Review of REIL with explanation of why locking the elbows and allowing the hips to lift some is advisable as long as the back is relaxed.   Manual therapy: to reduce pain and tissue tension, improve range of motion, neuromodulation, in order to promote improved ability to complete functional activities.  HOOKLYING Intermittent manual lumbar traction with belt around back of knees 12x10 seconds on/off No change in symptoms during or after No changes in anterior tibialis following  Neuromuscular Re-education: a technique or exercise performed with the goal of improving the level of communication between the body and the brain, such as for balance, motor control, muscle activation patterns, coordination, desensitization, quality of muscle contraction, proprioception, and/or kinesthetic sense needed for successful and safe completion of functional activities.  Hooklying pelvic tilt AROM in pain limited/free range 1x10 after finding pain limited range and learning how to perform Tactile cuing under lumbar spine Slightly more discomfort in anterior pelvic tilt  Hooklying diaphragmatic breathing with ADIM on exhale to help pt feel pulling in and TrA with attempt to improve awareness of pulling ribs down during drawing in 1x10 Mostly unsuccessful with rib control  Quadruped multifidi hip hikes with one knee on  folded towel 1x5 each side learning exercise Cuing for TrA and pelvic floor contraction during motion   Able to perform well so continued to progressed exercise  Quadruped Alternating hip extension with neutral to slight PPT, stepwise with knee lift toe slide to knee extension before/after lift off to improve lumbopelvic control and preferentially work multifidi to help with deep stability. No shifting.  2x10 each side with 5 second hold Cuing for TrA and pelvic floor contraction during motion   More difficulty stabilizing left LE extension Challenged by motor control Added to HEP  Pt required multimodal cuing for proper technique and to facilitate improved neuromuscular control, strength, range of motion, and functional ability resulting in improved performance and form.   PATIENT EDUCATION:  Education details: Exercise purpose/form. Self management techniques.  Person educated: Patient Education method: Medical Illustrator Education comprehension: verbalized understanding, returned demonstration, and needs further education  HOME EXERCISE PROGRAM: Access Code: [email or web address] URL: [email or web address] Date: 01/27/2024 Prepared by: Camie Cleverly  Exercises - Beginner Front Arm Support  - 1 x daily - 1 sets - 10 reps - 5 second hold hold - Prone Press Up  - 2 x daily - 10-15 reps - 1 second hold  ASSESSMENT:  CLINICAL IMPRESSION: Patient returns to PT with excellent HEP participation. No change in radicular symptoms and continues to have central discomfort, especially with transitional movements. Confirmed weakest myotome is L4 and pt is not at risk for knee buckling based on L3 myotome testing. He appears to have some mild improvement in lumbar motion and less morning pain/stiffness but does not seem to have experienced an obvious improvement from REIL besides that which could be expected from improving joint motion from repeated stretching. He showed no response to  manual traction, suggesting he does not have a strong load sensitivity. He continues to present with signs and symptoms suggesting poor motor control and small motion sensitivity that would benefit from improving motor control, strength,  and endurance of the active lumbar stabilizers. This is also appropriate for stage of healing if he has a healing lumbar derangement syndrome. Therefore, initiated exercises for this. Patient demonstrates decreased motor control but good tolerance for physical stresses of exercises. He was able to progress to quadruped alternating hip extension, which was most challenging for him without pain or too many form breaks. He would also likely benefit from interventions to improve thoracic spine motion in the future. Patient would benefit from continued management of limiting condition by skilled physical therapist to address remaining impairments and functional limitations to work towards stated goals and return to PLOF or maximal functional independence.   Mechanical sensitivities: neural tension in L LE, negative for load, shear?  MDT Assessment Provisional Classification: Other: mechanically inconclusive Stage: healing  From initial PT evaluation on 01/22/2024:  Patient is a 54 y.o. male referred to outpatient physical therapy with a medical diagnosis of spinal stenosis of lumbar region without neurogenetic claudication, acute pain of left knee, left lumbar radiculitis, hx of lumbar discectomy who presents with signs and symptoms consistent with chronic low back pain with resolving sub-acute L lumbar radiculopathy, likely mostly affecting L4. MDT classification of lumbar derangement syndrome with extension preference vs OTHER: mechanically inconclusive. Patient has significant loss of lumbar extension ROM, history of left sided radicular pain and weakness affecting function, and currently positive left SLR test and diminished L sided dermatomes at L4> S1 (others not tested  today), and decreased L SLS balance. Patient tolerated REIL well but had no change in myotomal strength after one set. Plan to retest after patient has practiced this at home over several days. Patient appears to be in the maintenance of reduction phase of rehab and will likely be ready for reconditioning of the low back and function as his neural signs improve. Patient presents with significant pain, ROM, paresthesia, muscle tension, joint stiffness, neural tension, muscle performance (strength/power/endurance), balance, and activity tolerance impairments that are limiting ability to complete usual activities that require/include lifting, (work, runner, broadcasting/film/video, daily activities), sleeping, sitting, exercising without difficulty. Patient will benefit from skilled physical therapy intervention to address current body structure impairments and activity limitations to improve function and work towards goals set in current POC in order to return to prior level of function or maximal functional improvement.     OBJECTIVE IMPAIRMENTS: Abnormal gait, decreased activity tolerance, decreased balance, decreased coordination, decreased endurance, decreased knowledge of condition, decreased mobility, difficulty walking, decreased ROM, decreased strength, hypomobility, increased muscle spasms, impaired flexibility, postural dysfunction, and pain.   ACTIVITY LIMITATIONS: carrying, lifting, bending, sitting, squatting, sleeping, and locomotion level  PARTICIPATION LIMITATIONS: community activity and occupation  PERSONAL FACTORS: Fitness, Past/current experiences, Profession, Time since onset of injury/illness/exacerbation, and 3+ comorbidities:  Tinea versicolor; Diverticulitis; Abdominal pain; Chronic insomnia; GAD (generalized anxiety disorder); ADD (attention deficit disorder); and Chronic back pain on their problem list. he  has a past medical history of Anxiety and Diverticulitis. he  has a past surgical history  that includes Hernia repair; Elbow surgery; and Back surgery are also affecting patient's functional outcome.   REHAB POTENTIAL: Good  CLINICAL DECISION MAKING: Evolving/moderate complexity  EVALUATION COMPLEXITY: Moderate   GOALS: Goals reviewed with patient? No  SHORT TERM GOALS: Target date: 02/06/2024  Patient will be independent with initial home exercise program for self-management of symptoms. Baseline: Initial HEP provided at IE (01/23/2024); Goal status: MET   LONG TERM GOALS: Target date: 04/15/2024  Patient will be independent with a long-term home exercise  program for self-management of symptoms.  Baseline: Initial HEP provided at IE (01/22/2024); Goal status: In progress  2.  Patient will demonstrate improved Modified Oswestry Disability Index (mODI) to equal or less than 10% to demonstrate improvement in overall condition and self-reported functional ability.  Baseline: 18% (01/22/2024); Goal status: In progress  3.  Patient will demonstrate the ability to complete 10 consecutive squats loaded with sufficient weight for RPE of at least 6/10 while demonstrating sufficient bracing technique and form without increased symptoms to help him return to weight lifting and heavier work activities. Baseline: reports difficulty with lifting (01/22/2024); Goal status: In progress  4.  Patient will demonstrate equal neurodynamic and myotome testing in B LE to demonstrate readiness for return to heavier activities needed for work and exercise.  Baseline: L SLR positive, diminished L myotome at L4 and S1 (01/22/2024); Goal status: In progress  5.  Patient will demonstrate improvement in Patient Specific Functional Scale (PSFS) of equal or greater than 8/10 points to reflect clinically significant improvement in patient's most valued functional activities. Baseline: to be measured at visit 2 as appropriate (01/22/2024); 5.7/10 (01/27/2024); Goal status: In progress  6.  Patient will report  NPRS equal or less than 3/10 during functional activities during the last 2 weeks to improve their abilitly to complete community, work and/or recreational activities with less limitation. Baseline: 10/10 (01/22/2024); Goal status: In progress PLAN:  PT FREQUENCY: 2x/week  PT DURATION: 8-12 weeks  PLANNED INTERVENTIONS: 97164- PT Re-evaluation, 97750- Physical Performance Testing, 97110-Therapeutic exercises, 97530- Therapeutic activity, [id number]- Neuromuscular re-education, 97535- Self Care, [id number]- Manual therapy, [id number]- Gait training, [phone]- Electrical stimulation (unattended), 20560 (1-2 muscles), 20561 (3+ muscles)- Dry Needling, Patient/Family education, Balance training, Cryotherapy, and Moist heat.  PLAN FOR NEXT SESSION: continuing working on progressive exercises for motor control/strength/endurance deficits without irritating healing radiculopathy. Assess thoracic mobility and consider interventions for that. Consider assessing hip and/or ankle mobility and address as needed.   MDT Plan PRINCIPLES OF MANAGEMENT Exercise Type: lumbar stabilization, Frequency: daily Other Exercises / Interventions: avoid stretching hamstrings (neural tension), REIL 2x a day.  Management Goals: improve active lumbar stabilizer motor control, strength, and endurance progressing to functional motions when ready. Improve spine mobility (especially thoracic).    Camie SAUNDERS. Juli, PT, DPT, Cert. MDT, PRA-C 01/27/2024, 6:40 PM  [facility] [address] [place], [place] [address] P: [phone] I F: [phone]  "

## 2024-01-28 ENCOUNTER — Ambulatory Visit: Admitting: Physical Therapy

## 2024-02-02 ENCOUNTER — Ambulatory Visit: Admitting: Physical Therapy

## 2024-02-09 ENCOUNTER — Ambulatory Visit: Admitting: Physical Therapy

## 2024-02-11 ENCOUNTER — Ambulatory Visit: Admitting: Physical Therapy

## 2024-02-11 ENCOUNTER — Encounter: Payer: Self-pay | Admitting: Physical Therapy

## 2024-02-11 DIAGNOSIS — M6281 Muscle weakness (generalized): Secondary | ICD-10-CM

## 2024-02-11 DIAGNOSIS — M5459 Other low back pain: Secondary | ICD-10-CM | POA: Diagnosis not present

## 2024-02-11 DIAGNOSIS — M5416 Radiculopathy, lumbar region: Secondary | ICD-10-CM

## 2024-02-11 NOTE — Therapy (Unsigned)
 " OUTPATIENT PHYSICAL THERAPY TREATMENT   Patient Name: Maurice Sims MRN: [id number] DOB:October 07, 1970, 54 y.o., male Today's Date: 02/11/2024  END OF SESSION:  PT End of Session - 02/11/24 1642     Visit Number 3    Number of Visits 17    Date for Recertification  04/15/24    Authorization Type [employer] reporting period from 01/22/2024    Authorization Time Period [facility] 08/15/23-08/13/24  VL 23    Authorization - Number of Visits 23    Progress Note Due on Visit 10    PT Start Time 1645    PT Stop Time 1730    PT Time Calculation (min) 45 min    Activity Tolerance Patient tolerated treatment well;No increased pain    Behavior During Therapy [facility] for tasks assessed/performed            Past Medical History:  Diagnosis Date   Anxiety    Diverticulitis    Past Surgical History:  Procedure Laterality Date   BACK SURGERY     ELBOW SURGERY     HERNIA REPAIR     Patient Active Problem List   Diagnosis Date Noted   Chronic back pain 01/23/2018   ADD (attention deficit disorder) 06/12/2016   GAD (generalized anxiety disorder) 01/02/2015   Chronic insomnia 04/26/2014   Diverticulitis 05/18/2013   Abdominal pain 05/18/2013   Tinea versicolor 11/19/2012    PCP: Sophronia Ozell BROCKS, MD   REFERRING PROVIDER: Garnette Ozell Hamilton, NP  REFERRING DIAG: spinal stenosis of lumbar region without neurogenetic claudication, acute pain of left knee, left lumbar radiculitis, hx of lumbar discectomy  Rationale for Evaluation and Treatment: Rehabilitation  THERAPY DIAG:  Other low back pain  Radiculopathy, lumbar region  Muscle weakness (generalized)  ONSET DATE: generalized low back pain chronic since episode of R radiculopathy treated with L5-S1 discectomy in 2020. Most recent episode of acute L lumbar radiculopathy started about 1 week prior to Thanksgiving 2025  SUBJECTIVE:                                                                                                                                                                                            PERTINENT HISTORY:  Patient is a 54 y.o. male who presents to outpatient physical therapy with a referral for medical diagnosis spinal stenosis of lumbar region without neurogenetic claudication, acute pain of left knee, left lumbar radiculitis, hx of lumbar discectomy and request for MDT/McKenzie approach. This patient's chief complaints consist of chronic low back pain with intermittent shooting into B posterior hips with resolving recent episode of L radicular pain and weakness  to the foot  leading to the following functional deficits: difficulty lifting, (work, runner, broadcasting/film/video, daily activities), sleeping, sitting, exercising. Relevant past medical history and comorbidities include the following: Tinea versicolor; Diverticulitis; Abdominal pain; Chronic insomnia; GAD (generalized anxiety disorder); ADD (attention deficit disorder); and Chronic back pain on their problem list. he  has a past medical history of Anxiety and Diverticulitis. he  has a past surgical history that includes Hernia repair; Elbow surgery; and Back surgery. Patient denies hx of cancer, stroke, seizures, lung problems, heart problems, diabetes, unexplained weight loss, unexplained changes in bowel or bladder problems, unexplained stumbling or dropping things, and osteoporosis  SUBJECTIVE STATEMENT: Patient states he is a little better since last time he was here. He missed last session because of flu but is feeling better now. States he's been shoveling snow and moving wood so he is sore from that. He also slipped on ice on his way to come to PT so his back feels more aggravated than before.  PAIN: NPRS: 1/10, in low back   From initial PT evaluation on 01/22/24: NPRS: Current: 1/10,  Best: 1/10, Worst: 10/10. (4/10 in last 2 weeks) Pain location: low back and glutes Pain description: aching in low back, glutes/hips, shooting pain to  the buttocks. Sore to touch. Occasional paresthesia R > L. Aggravating factors: see below Relieving factors: see below   PRECAUTIONS: None  WEIGHT BEARING RESTRICTIONS: No  FALLS:  Has patient fallen in last 6 months? No  PATIENT GOALS: Feel better, to really learn what he should and shouldn't be doing.     OBJECTIVE    TREATMENT    Neuromuscular Re-education: a technique or exercise performed with the goal of improving the level of communication between the body and the brain, such as for balance, motor control, muscle activation patterns, coordination, desensitization, quality of muscle contraction, proprioception, and/or kinesthetic sense needed for successful and safe completion of functional activities.   [place] shoulder flexion on plinth to improve core activation  4 x 10 with 8# DB in each hand     Added to HEP   Supine Alternating 90/90 toe touch on plinth to improve trunk motor control and coordinated LE movement  4 x 10 on each side   Added to HEP  Patient received cueing in how to perform exercise and needed knees brought closer to chest to accurately perform without increasing discomfort in low back. He also needed cueing in ADIM and remembering to breathe. Patient had a thera tube put under back to provide a cue when he was not pressing low back into plinth   Quadruped cat-cow to improve lumbopelvic control 1x10  Quadruped multifidi hip hikes with one knee on folded towel 3 x 10 each side  Cuing for TrA and pelvic floor contraction during motion, used cat-cow motion to help him find neutral on his own    Pt required multimodal cuing for proper technique and to facilitate improved neuromuscular control, strength, range of motion, and functional ability resulting in improved performance and form.   PATIENT EDUCATION:  Education details: Exercise purpose/form. Self management techniques.  Person educated: Patient Education method: Software Engineer Education comprehension: verbalized understanding, returned demonstration, and needs further education  HOME EXERCISE PROGRAM: Access Code: [email or web address] URL: [email or web address] Date: 02/11/2024 Prepared by: Vernell Mariscal  Exercises - Beginner Front Arm Support  - 1 x daily - 2 sets - 10 reps - 5 second hold hold - Prone Press Up  - 2 x daily -  10-15 reps - 1 second hold - Supine 90/90 Alternating Toe Touch  - 1-2 x daily - 7 x weekly - 3 sets - 10 reps - Supine Shoulder Flexion Extension AAROM with Dowel  - 1-2 x daily - 7 x weekly - 3 sets - 10 reps  ASSESSMENT:  CLINICAL IMPRESSION: Today's session focused on educating patient on form during exercises performed to improve core activation without cues. Initially patient needed verbal/tactile cues with examples to reduce lumbar extension and improve movement timing. Pt demonstrated improved core control and activation by end of session with repetition. He was taught cat-cow movement to be able to find neutral spine on his own. He had no increase in his LBP but felt fatigued after doing the above exercises and thought it might be from is recent flu recovery. Patient would benefit from continued management of limiting condition by skilled physical therapist to address remaining impairments and functional limitations to work towards stated goals and return to PLOF or maximal functional independence.    Mechanical sensitivities: neural tension in L LE, negative for load, shear?  MDT Assessment Provisional Classification: Other: mechanically inconclusive Stage: healing  From initial PT evaluation on 01/22/2024:  Patient is a 54 y.o. male referred to outpatient physical therapy with a medical diagnosis of spinal stenosis of lumbar region without neurogenetic claudication, acute pain of left knee, left lumbar radiculitis, hx of lumbar discectomy who presents with signs and symptoms consistent with chronic low back pain  with resolving sub-acute L lumbar radiculopathy, likely mostly affecting L4. MDT classification of lumbar derangement syndrome with extension preference vs OTHER: mechanically inconclusive. Patient has significant loss of lumbar extension ROM, history of left sided radicular pain and weakness affecting function, and currently positive left SLR test and diminished L sided dermatomes at L4> S1 (others not tested today), and decreased L SLS balance. Patient tolerated REIL well but had no change in myotomal strength after one set. Plan to retest after patient has practiced this at home over several days. Patient appears to be in the maintenance of reduction phase of rehab and will likely be ready for reconditioning of the low back and function as his neural signs improve. Patient presents with significant pain, ROM, paresthesia, muscle tension, joint stiffness, neural tension, muscle performance (strength/power/endurance), balance, and activity tolerance impairments that are limiting ability to complete usual activities that require/include lifting, (work, runner, broadcasting/film/video, daily activities), sleeping, sitting, exercising without difficulty. Patient will benefit from skilled physical therapy intervention to address current body structure impairments and activity limitations to improve function and work towards goals set in current POC in order to return to prior level of function or maximal functional improvement.     OBJECTIVE IMPAIRMENTS: Abnormal gait, decreased activity tolerance, decreased balance, decreased coordination, decreased endurance, decreased knowledge of condition, decreased mobility, difficulty walking, decreased ROM, decreased strength, hypomobility, increased muscle spasms, impaired flexibility, postural dysfunction, and pain.   ACTIVITY LIMITATIONS: carrying, lifting, bending, sitting, squatting, sleeping, and locomotion level  PARTICIPATION LIMITATIONS: community activity and  occupation  PERSONAL FACTORS: Fitness, Past/current experiences, Profession, Time since onset of injury/illness/exacerbation, and 3+ comorbidities:  Tinea versicolor; Diverticulitis; Abdominal pain; Chronic insomnia; GAD (generalized anxiety disorder); ADD (attention deficit disorder); and Chronic back pain on their problem list. he  has a past medical history of Anxiety and Diverticulitis. he  has a past surgical history that includes Hernia repair; Elbow surgery; and Back surgery are also affecting patient's functional outcome.   REHAB POTENTIAL: Good  CLINICAL DECISION MAKING: Evolving/moderate  complexity  EVALUATION COMPLEXITY: Moderate   GOALS: Goals reviewed with patient? No  SHORT TERM GOALS: Target date: 02/06/2024  Patient will be independent with initial home exercise program for self-management of symptoms. Baseline: Initial HEP provided at IE (01/23/2024); Goal status: MET   LONG TERM GOALS: Target date: 04/15/2024  Patient will be independent with a long-term home exercise program for self-management of symptoms.  Baseline: Initial HEP provided at IE (01/22/2024); Goal status: In progress  2.  Patient will demonstrate improved Modified Oswestry Disability Index (mODI) to equal or less than 10% to demonstrate improvement in overall condition and self-reported functional ability.  Baseline: 18% (01/22/2024); Goal status: In progress  3.  Patient will demonstrate the ability to complete 10 consecutive squats loaded with sufficient weight for RPE of at least 6/10 while demonstrating sufficient bracing technique and form without increased symptoms to help him return to weight lifting and heavier work activities. Baseline: reports difficulty with lifting (01/22/2024); Goal status: In progress  4.  Patient will demonstrate equal neurodynamic and myotome testing in B LE to demonstrate readiness for return to heavier activities needed for work and exercise.  Baseline: L SLR positive,  diminished L myotome at L4 and S1 (01/22/2024); Goal status: In progress  5.  Patient will demonstrate improvement in Patient Specific Functional Scale (PSFS) of equal or greater than 8/10 points to reflect clinically significant improvement in patient's most valued functional activities. Baseline: to be measured at visit 2 as appropriate (01/22/2024); 5.7/10 (01/27/2024); Goal status: In progress  6.  Patient will report NPRS equal or less than 3/10 during functional activities during the last 2 weeks to improve their abilitly to complete community, work and/or recreational activities with less limitation. Baseline: 10/10 (01/22/2024); Goal status: In progress PLAN:  PT FREQUENCY: 2x/week  PT DURATION: 8-12 weeks  PLANNED INTERVENTIONS: 97164- PT Re-evaluation, 97750- Physical Performance Testing, 97110-Therapeutic exercises, 97530- Therapeutic activity, [id number]- Neuromuscular re-education, 97535- Self Care, [id number]- Manual therapy, [id number]- Gait training, [phone]- Electrical stimulation (unattended), 20560 (1-2 muscles), 20561 (3+ muscles)- Dry Needling, Patient/Family education, Balance training, Cryotherapy, and Moist heat.  PLAN FOR NEXT SESSION: Continue checking for patient comprehension and accuracy with HEP and new exercises introduced as  tx sessions continue. Consider continuing working on progressive exercises for motor control/strength/endurance deficits without irritating healing radiculopathy. Consider assessing hip and/or ankle mobility and address as needed.   MDT Plan PRINCIPLES OF MANAGEMENT Exercise Type: lumbar stabilization, Frequency: daily Other Exercises / Interventions: avoid stretching hamstrings (neural tension), REIL 2x a day.  Management Goals: improve active lumbar stabilizer motor control, strength, and endurance progressing to functional motions when ready. Improve spine mobility (especially thoracic).   Vernell Mariscal SPT  Student physical therapist under direct  supervision of licensed physical therapists during the entirety of the session.    Camie SAUNDERS. Juli, PT, DPT, Cert. MDT, PRA-C 02/11/24, 7:00 PM  [facility] [address] [place], [place] [address] P: [phone] I F: [phone]  "

## 2024-02-16 NOTE — Therapy (Incomplete)
 " OUTPATIENT PHYSICAL THERAPY TREATMENT   Patient Name: Maurice Sims MRN: [id number] DOB:February 10, 1970, 54 y.o., male Today's Date: 02/16/2024  END OF SESSION:      Past Medical History:  Diagnosis Date   Anxiety    Diverticulitis    Past Surgical History:  Procedure Laterality Date   BACK SURGERY     ELBOW SURGERY     HERNIA REPAIR     Patient Active Problem List   Diagnosis Date Noted   Chronic back pain 01/23/2018   ADD (attention deficit disorder) 06/12/2016   GAD (generalized anxiety disorder) 01/02/2015   Chronic insomnia 04/26/2014   Diverticulitis 05/18/2013   Abdominal pain 05/18/2013   Tinea versicolor 11/19/2012    PCP: Sophronia Ozell BROCKS, MD   REFERRING PROVIDER: Garnette Ozell Hamilton, NP  REFERRING DIAG: spinal stenosis of lumbar region without neurogenetic claudication, acute pain of left knee, left lumbar radiculitis, hx of lumbar discectomy  Rationale for Evaluation and Treatment: Rehabilitation  THERAPY DIAG:  Other low back pain  Radiculopathy, lumbar region  Muscle weakness (generalized)  ONSET DATE: generalized low back pain chronic since episode of R radiculopathy treated with L5-S1 discectomy in 2020. Most recent episode of acute L lumbar radiculopathy started about 1 week prior to Thanksgiving 2025  SUBJECTIVE:                                                                                                                                                                                           PERTINENT HISTORY:  Patient is a 54 y.o. male who presents to outpatient physical therapy with a referral for medical diagnosis spinal stenosis of lumbar region without neurogenetic claudication, acute pain of left knee, left lumbar radiculitis, hx of lumbar discectomy and request for MDT/McKenzie approach. This patient's chief complaints consist of chronic low back pain with intermittent shooting into B posterior hips with resolving recent  episode of L radicular pain and weakness to the foot  leading to the following functional deficits: difficulty lifting, (work, strength training, daily activities), sleeping, sitting, exercising. Relevant past medical history and comorbidities include the following: Tinea versicolor; Diverticulitis; Abdominal pain; Chronic insomnia; GAD (generalized anxiety disorder); ADD (attention deficit disorder); and Chronic back pain on their problem list. he  has a past medical history of Anxiety and Diverticulitis. he  has a past surgical history that includes Hernia repair; Elbow surgery; and Back surgery. Patient denies hx of cancer, stroke, seizures, lung problems, heart problems, diabetes, unexplained weight loss, unexplained changes in bowel or bladder problems, unexplained stumbling or dropping things, and osteoporosis  SUBJECTIVE STATEMENT:  ***  Patient states he is a little  better since last time he was here. He missed last session because of flu but is feeling better now. States he's been shoveling snow and moving wood so he is sore from that. He also slipped on ice on his way to come to PT so his back feels more aggravated than before.  PAIN: NPRS: 1/10, in low back   From initial PT evaluation on 01/22/24: NPRS: Current: 1/10,  Best: 1/10, Worst: 10/10. (4/10 in last 2 weeks) Pain location: low back and glutes Pain description: aching in low back, glutes/hips, shooting pain to the buttocks. Sore to touch. Occasional paresthesia R > L. Aggravating factors: see below Relieving factors: see below   PRECAUTIONS: None  WEIGHT BEARING RESTRICTIONS: No  FALLS:  Has patient fallen in last 6 months? No  PATIENT GOALS: Feel better, to really learn what he should and shouldn't be doing.     OBJECTIVE    TREATMENT  02/16/24: ***   Neuromuscular Re-education: a technique or exercise performed with the goal of improving the level of communication between the body and the brain, such as for  balance, motor control, muscle activation patterns, coordination, desensitization, quality of muscle contraction, proprioception, and/or kinesthetic sense needed for successful and safe completion of functional activities.   [place] shoulder flexion on plinth to improve core activation  4 x 10 with 8# DB in each hand     Added to HEP   Supine Alternating 90/90 toe touch on plinth to improve trunk motor control and coordinated LE movement  4 x 10 on each side   Added to HEP  Patient received cueing in how to perform exercise and needed knees brought closer to chest to accurately perform without increasing discomfort in low back. He also needed cueing in ADIM and remembering to breathe. Patient had a thera tube put under back to provide a cue when he was not pressing low back into plinth   Quadruped cat-cow to improve lumbopelvic control 1x10  Quadruped multifidi hip hikes with one knee on folded towel 3 x 10 each side  Cuing for TrA and pelvic floor contraction during motion, used cat-cow motion to help him find neutral on his own    Pt required multimodal cuing for proper technique and to facilitate improved neuromuscular control, strength, range of motion, and functional ability resulting in improved performance and form.   PATIENT EDUCATION:  Education details: Exercise purpose/form. Self management techniques.  Person educated: Patient Education method: Medical Illustrator Education comprehension: verbalized understanding, returned demonstration, and needs further education  HOME EXERCISE PROGRAM: Access Code: [email or web address] URL: [email or web address] Date: 02/11/2024 Prepared by: Vernell Mariscal  Exercises - Beginner Front Arm Support  - 1 x daily - 2 sets - 10 reps - 5 second hold hold - Prone Press Up  - 2 x daily - 10-15 reps - 1 second hold - Supine 90/90 Alternating Toe Touch  - 1-2 x daily - 7 x weekly - 3 sets - 10 reps - Supine Shoulder Flexion  Extension AAROM with Dowel  - 1-2 x daily - 7 x weekly - 3 sets - 10 reps  ASSESSMENT:  CLINICAL IMPRESSION: *** Today's session focused on educating patient on form during exercises performed to improve core activation without cues. Initially patient needed verbal/tactile cues with examples to reduce lumbar extension and improve movement timing. Pt demonstrated improved core control and activation by end of session with repetition. He was taught cat-cow movement to be able to find neutral spine on his  own. He had no increase in his LBP but felt fatigued after doing the above exercises and thought it might be from is recent flu recovery. Patient would benefit from continued management of limiting condition by skilled physical therapist to address remaining impairments and functional limitations to work towards stated goals and return to PLOF or maximal functional independence.   From initial PT evaluation on 01/22/2024:  Patient is a 54 y.o. male referred to outpatient physical therapy with a medical diagnosis of spinal stenosis of lumbar region without neurogenetic claudication, acute pain of left knee, left lumbar radiculitis, hx of lumbar discectomy who presents with signs and symptoms consistent with chronic low back pain with resolving sub-acute L lumbar radiculopathy, likely mostly affecting L4. MDT classification of lumbar derangement syndrome with extension preference vs OTHER: mechanically inconclusive. Patient has significant loss of lumbar extension ROM, history of left sided radicular pain and weakness affecting function, and currently positive left SLR test and diminished L sided dermatomes at L4> S1 (others not tested today), and decreased L SLS balance. Patient tolerated REIL well but had no change in myotomal strength after one set. Plan to retest after patient has practiced this at home over several days. Patient appears to be in the maintenance of reduction phase of rehab and will likely be  ready for reconditioning of the low back and function as his neural signs improve. Patient presents with significant pain, ROM, paresthesia, muscle tension, joint stiffness, neural tension, muscle performance (strength/power/endurance), balance, and activity tolerance impairments that are limiting ability to complete usual activities that require/include lifting, (work, runner, broadcasting/film/video, daily activities), sleeping, sitting, exercising without difficulty. Patient will benefit from skilled physical therapy intervention to address current body structure impairments and activity limitations to improve function and work towards goals set in current POC in order to return to prior level of function or maximal functional improvement.     OBJECTIVE IMPAIRMENTS: Abnormal gait, decreased activity tolerance, decreased balance, decreased coordination, decreased endurance, decreased knowledge of condition, decreased mobility, difficulty walking, decreased ROM, decreased strength, hypomobility, increased muscle spasms, impaired flexibility, postural dysfunction, and pain.   ACTIVITY LIMITATIONS: carrying, lifting, bending, sitting, squatting, sleeping, and locomotion level  PARTICIPATION LIMITATIONS: community activity and occupation  PERSONAL FACTORS: Fitness, Past/current experiences, Profession, Time since onset of injury/illness/exacerbation, and 3+ comorbidities:  Tinea versicolor; Diverticulitis; Abdominal pain; Chronic insomnia; GAD (generalized anxiety disorder); ADD (attention deficit disorder); and Chronic back pain on their problem list. he  has a past medical history of Anxiety and Diverticulitis. he  has a past surgical history that includes Hernia repair; Elbow surgery; and Back surgery are also affecting patient's functional outcome.   REHAB POTENTIAL: Good  CLINICAL DECISION MAKING: Evolving/moderate complexity  EVALUATION COMPLEXITY: Moderate   GOALS: Goals reviewed with patient? No  SHORT  TERM GOALS: Target date: 02/06/2024  Patient will be independent with initial home exercise program for self-management of symptoms. Baseline: Initial HEP provided at IE (01/23/2024); Goal status: MET   LONG TERM GOALS: Target date: 04/15/2024  Patient will be independent with a long-term home exercise program for self-management of symptoms.  Baseline: Initial HEP provided at IE (01/22/2024); Goal status: In progress  2.  Patient will demonstrate improved Modified Oswestry Disability Index (mODI) to equal or less than 10% to demonstrate improvement in overall condition and self-reported functional ability.  Baseline: 18% (01/22/2024); Goal status: In progress  3.  Patient will demonstrate the ability to complete 10 consecutive squats loaded with sufficient weight for RPE of at least  6/10 while demonstrating sufficient bracing technique and form without increased symptoms to help him return to weight lifting and heavier work activities. Baseline: reports difficulty with lifting (01/22/2024); Goal status: In progress  4.  Patient will demonstrate equal neurodynamic and myotome testing in B LE to demonstrate readiness for return to heavier activities needed for work and exercise.  Baseline: L SLR positive, diminished L myotome at L4 and S1 (01/22/2024); Goal status: In progress  5.  Patient will demonstrate improvement in Patient Specific Functional Scale (PSFS) of equal or greater than 8/10 points to reflect clinically significant improvement in patient's most valued functional activities. Baseline: to be measured at visit 2 as appropriate (01/22/2024); 5.7/10 (01/27/2024); Goal status: In progress  6.  Patient will report NPRS equal or less than 3/10 during functional activities during the last 2 weeks to improve their abilitly to complete community, work and/or recreational activities with less limitation. Baseline: 10/10 (01/22/2024); Goal status: In progress PLAN:  PT FREQUENCY: 2x/week  PT  DURATION: 8-12 weeks  PLANNED INTERVENTIONS: 97164- PT Re-evaluation, 97750- Physical Performance Testing, 97110-Therapeutic exercises, 97530- Therapeutic activity, [id number]- Neuromuscular re-education, 97535- Self Care, [id number]- Manual therapy, [id number]- Gait training, [phone]- Electrical stimulation (unattended), 20560 (1-2 muscles), 20561 (3+ muscles)- Dry Needling, Patient/Family education, Balance training, Cryotherapy, and Moist heat.  PLAN FOR NEXT SESSION: Continue checking for patient comprehension and accuracy with HEP and new exercises introduced as  tx sessions continue. Consider continuing working on progressive exercises for motor control/strength/endurance deficits without irritating healing radiculopathy. Consider assessing hip and/or ankle mobility and address as needed.    Maryanne Finder, PT, DPT Physical Therapist - [facility] 02/16/24, 4:10 PM  "

## 2024-02-17 ENCOUNTER — Ambulatory Visit: Admitting: Physical Therapy

## 2024-02-17 DIAGNOSIS — M6281 Muscle weakness (generalized): Secondary | ICD-10-CM

## 2024-02-17 DIAGNOSIS — M5416 Radiculopathy, lumbar region: Secondary | ICD-10-CM

## 2024-02-17 DIAGNOSIS — M5459 Other low back pain: Secondary | ICD-10-CM

## 2024-02-19 ENCOUNTER — Ambulatory Visit: Admitting: Physical Therapy

## 2024-02-19 NOTE — Therapy (Unsigned)
 " OUTPATIENT PHYSICAL THERAPY TREATMENT   Patient Name: Maurice Sims MRN: [id number] DOB:02-Nov-1970, 54 y.o., male Today's Date: 02/19/2024  END OF SESSION:      Past Medical History:  Diagnosis Date   Anxiety    Diverticulitis    Past Surgical History:  Procedure Laterality Date   BACK SURGERY     ELBOW SURGERY     HERNIA REPAIR     Patient Active Problem List   Diagnosis Date Noted   Chronic back pain 01/23/2018   ADD (attention deficit disorder) 06/12/2016   GAD (generalized anxiety disorder) 01/02/2015   Chronic insomnia 04/26/2014   Diverticulitis 05/18/2013   Abdominal pain 05/18/2013   Tinea versicolor 11/19/2012    PCP: Sophronia Ozell BROCKS, MD   REFERRING PROVIDER: Garnette Ozell Hamilton, NP  REFERRING DIAG: spinal stenosis of lumbar region without neurogenetic claudication, acute pain of left knee, left lumbar radiculitis, hx of lumbar discectomy  Rationale for Evaluation and Treatment: Rehabilitation  THERAPY DIAG:  No diagnosis found.  ONSET DATE: generalized low back pain chronic since episode of R radiculopathy treated with L5-S1 discectomy in 2020. Most recent episode of acute L lumbar radiculopathy started about 1 week prior to Thanksgiving 2025  SUBJECTIVE:                                                                                                                                                                                           PERTINENT HISTORY:  Patient is a 54 y.o. male who presents to outpatient physical therapy with a referral for medical diagnosis spinal stenosis of lumbar region without neurogenetic claudication, acute pain of left knee, left lumbar radiculitis, hx of lumbar discectomy and request for MDT/McKenzie approach. This patient's chief complaints consist of chronic low back pain with intermittent shooting into B posterior hips with resolving recent episode of L radicular pain and weakness to the foot  leading to the  following functional deficits: difficulty lifting, (work, strength training, daily activities), sleeping, sitting, exercising. Relevant past medical history and comorbidities include the following: Tinea versicolor; Diverticulitis; Abdominal pain; Chronic insomnia; GAD (generalized anxiety disorder); ADD (attention deficit disorder); and Chronic back pain on their problem list. he  has a past medical history of Anxiety and Diverticulitis. he  has a past surgical history that includes Hernia repair; Elbow surgery; and Back surgery. Patient denies hx of cancer, stroke, seizures, lung problems, heart problems, diabetes, unexplained weight loss, unexplained changes in bowel or bladder problems, unexplained stumbling or dropping things, and osteoporosis  SUBJECTIVE STATEMENT:    Patient states he is   PAIN: NPRS: 1/10, in low back   From  initial PT evaluation on 01/22/24: NPRS: Current: 1/10,  Best: 1/10, Worst: 10/10. (4/10 in last 2 weeks) Pain location: low back and glutes Pain description: aching in low back, glutes/hips, shooting pain to the buttocks. Sore to touch. Occasional paresthesia R > L. Aggravating factors: see below Relieving factors: see below   PRECAUTIONS: None  WEIGHT BEARING RESTRICTIONS: No  FALLS:  Has patient fallen in last 6 months? No  PATIENT GOALS: Feel better, to really learn what he should and shouldn't be doing.     OBJECTIVE    TREATMENT  02/19/24:   Neuromuscular Re-education: a technique or exercise performed with the goal of improving the level of communication between the body and the brain, such as for balance, motor control, muscle activation patterns, coordination, desensitization, quality of muscle contraction, proprioception, and/or kinesthetic sense needed for successful and safe completion of functional activities.   [place] shoulder flexion on plinth to improve core activation  4 x 10 with 8# DB in each hand     Added to HEP  Glute  bridges on plinth emphasizing posterior pelvic tilt to improve motor control and reduce lumbar compensation  Seated trunk rotation with ball and legs crossed to improve rotational motor control and core coordination   Supine Alternating 90/90 toe touch on plinth to improve trunk motor control and coordinated LE movement  4 x 10 on each side   Added to HEP  Patient received cueing in how to perform exercise and needed knees brought closer to chest to accurately perform without increasing discomfort in low back. He also needed cueing in ADIM and remembering to breathe. Patient had a thera tube put under back to provide a cue when he was not pressing low back into plinth   Quadruped cat-cow to improve lumbopelvic control 1x10  Quadruped multifidi hip hikes with one knee on folded towel 3 x 10 each side  Cuing for TrA and pelvic floor contraction during motion, used cat-cow motion to help him find neutral on his own    Pt required multimodal cuing for proper technique and to facilitate improved neuromuscular control, strength, range of motion, and functional ability resulting in improved performance and form.   PATIENT EDUCATION:  Education details: Exercise purpose/form. Self management techniques.  Person educated: Patient Education method: Medical Illustrator Education comprehension: verbalized understanding, returned demonstration, and needs further education  HOME EXERCISE PROGRAM: Access Code: [email or web address] URL: [email or web address] Date: 02/11/2024 Prepared by: Vernell Mariscal  Exercises - Beginner Front Arm Support  - 1 x daily - 2 sets - 10 reps - 5 second hold hold - Prone Press Up  - 2 x daily - 10-15 reps - 1 second hold - Supine 90/90 Alternating Toe Touch  - 1-2 x daily - 7 x weekly - 3 sets - 10 reps - Supine Shoulder Flexion Extension AAROM with Dowel  - 1-2 x daily - 7 x weekly - 3 sets - 10 reps  ASSESSMENT:  CLINICAL IMPRESSION:  02/23/2024 Today's session focused on    From initial PT evaluation on 01/22/2024:  Patient is a 54 y.o. male referred to outpatient physical therapy with a medical diagnosis of spinal stenosis of lumbar region without neurogenetic claudication, acute pain of left knee, left lumbar radiculitis, hx of lumbar discectomy who presents with signs and symptoms consistent with chronic low back pain with resolving sub-acute L lumbar radiculopathy, likely mostly affecting L4. MDT classification of lumbar derangement syndrome with extension preference vs OTHER: mechanically inconclusive. Patient has significant loss  of lumbar extension ROM, history of left sided radicular pain and weakness affecting function, and currently positive left SLR test and diminished L sided dermatomes at L4> S1 (others not tested today), and decreased L SLS balance. Patient tolerated REIL well but had no change in myotomal strength after one set. Plan to retest after patient has practiced this at home over several days. Patient appears to be in the maintenance of reduction phase of rehab and will likely be ready for reconditioning of the low back and function as his neural signs improve. Patient presents with significant pain, ROM, paresthesia, muscle tension, joint stiffness, neural tension, muscle performance (strength/power/endurance), balance, and activity tolerance impairments that are limiting ability to complete usual activities that require/include lifting, (work, runner, broadcasting/film/video, daily activities), sleeping, sitting, exercising without difficulty. Patient will benefit from skilled physical therapy intervention to address current body structure impairments and activity limitations to improve function and work towards goals set in current POC in order to return to prior level of function or maximal functional improvement.     OBJECTIVE IMPAIRMENTS: Abnormal gait, decreased activity tolerance, decreased balance, decreased coordination,  decreased endurance, decreased knowledge of condition, decreased mobility, difficulty walking, decreased ROM, decreased strength, hypomobility, increased muscle spasms, impaired flexibility, postural dysfunction, and pain.   ACTIVITY LIMITATIONS: carrying, lifting, bending, sitting, squatting, sleeping, and locomotion level  PARTICIPATION LIMITATIONS: community activity and occupation  PERSONAL FACTORS: Fitness, Past/current experiences, Profession, Time since onset of injury/illness/exacerbation, and 3+ comorbidities:  Tinea versicolor; Diverticulitis; Abdominal pain; Chronic insomnia; GAD (generalized anxiety disorder); ADD (attention deficit disorder); and Chronic back pain on their problem list. he  has a past medical history of Anxiety and Diverticulitis. he  has a past surgical history that includes Hernia repair; Elbow surgery; and Back surgery are also affecting patient's functional outcome.   REHAB POTENTIAL: Good  CLINICAL DECISION MAKING: Evolving/moderate complexity  EVALUATION COMPLEXITY: Moderate   GOALS: Goals reviewed with patient? No  SHORT TERM GOALS: Target date: 02/06/2024  Patient will be independent with initial home exercise program for self-management of symptoms. Baseline: Initial HEP provided at IE (01/23/2024); Goal status: MET   LONG TERM GOALS: Target date: 04/15/2024  Patient will be independent with a long-term home exercise program for self-management of symptoms.  Baseline: Initial HEP provided at IE (01/22/2024); Goal status: In progress  2.  Patient will demonstrate improved Modified Oswestry Disability Index (mODI) to equal or less than 10% to demonstrate improvement in overall condition and self-reported functional ability.  Baseline: 18% (01/22/2024); Goal status: In progress  3.  Patient will demonstrate the ability to complete 10 consecutive squats loaded with sufficient weight for RPE of at least 6/10 while demonstrating sufficient bracing technique  and form without increased symptoms to help him return to weight lifting and heavier work activities. Baseline: reports difficulty with lifting (01/22/2024); Goal status: In progress  4.  Patient will demonstrate equal neurodynamic and myotome testing in B LE to demonstrate readiness for return to heavier activities needed for work and exercise.  Baseline: L SLR positive, diminished L myotome at L4 and S1 (01/22/2024); Goal status: In progress  5.  Patient will demonstrate improvement in Patient Specific Functional Scale (PSFS) of equal or greater than 8/10 points to reflect clinically significant improvement in patient's most valued functional activities. Baseline: to be measured at visit 2 as appropriate (01/22/2024); 5.7/10 (01/27/2024); Goal status: In progress  6.  Patient will report NPRS equal or less than 3/10 during functional activities during the last 2 weeks  to improve their abilitly to complete community, work and/or recreational activities with less limitation. Baseline: 10/10 (01/22/2024); Goal status: In progress PLAN:  PT FREQUENCY: 2x/week  PT DURATION: 8-12 weeks  PLANNED INTERVENTIONS: 97164- PT Re-evaluation, 97750- Physical Performance Testing, 97110-Therapeutic exercises, 97530- Therapeutic activity, [id number]- Neuromuscular re-education, 97535- Self Care, [id number]- Manual therapy, [id number]- Gait training, [phone]- Electrical stimulation (unattended), 20560 (1-2 muscles), 20561 (3+ muscles)- Dry Needling, Patient/Family education, Balance training, Cryotherapy, and Moist heat.  PLAN FOR NEXT SESSION: Continue checking for patient comprehension and accuracy with HEP and new exercises introduced as  tx sessions continue. Consider continuing working on progressive exercises for motor control/strength/endurance deficits without irritating healing radiculopathy. Consider assessing hip and/or ankle mobility and address as needed.   Anson Peddie SPT  Maryanne Finder, PT, DPT Physical Therapist  - [facility] 02/19/24, 10:00 AM  "

## 2024-02-23 ENCOUNTER — Ambulatory Visit: Admitting: Physical Therapy

## 2024-02-25 ENCOUNTER — Ambulatory Visit: Admitting: Physical Therapy

## 2024-03-01 ENCOUNTER — Ambulatory Visit: Admitting: Physical Therapy

## 2024-03-02 ENCOUNTER — Ambulatory Visit: Admitting: Physical Therapy

## 2024-03-04 ENCOUNTER — Ambulatory Visit: Admitting: Physical Therapy

## 2024-03-09 ENCOUNTER — Ambulatory Visit: Admitting: Physical Therapy

## 2024-03-11 ENCOUNTER — Ambulatory Visit: Admitting: Physical Therapy

## 2024-03-22 ENCOUNTER — Ambulatory Visit: Admitting: Physical Therapy

## 2024-03-24 ENCOUNTER — Ambulatory Visit: Admitting: Physical Therapy
